# Patient Record
Sex: Male | Born: 1935 | Race: Black or African American | Hispanic: No | Smoking: Never smoker
Health system: Southern US, Community
[De-identification: ages and names within clinical notes are randomized; demographics above are authoritative.]

## PROBLEM LIST (undated history)

## (undated) DIAGNOSIS — F32A Depression, unspecified: Secondary | ICD-10-CM

## (undated) DIAGNOSIS — M1990 Unspecified osteoarthritis, unspecified site: Secondary | ICD-10-CM

## (undated) DIAGNOSIS — R0602 Shortness of breath: Secondary | ICD-10-CM

## (undated) DIAGNOSIS — G20 Parkinson's disease: Secondary | ICD-10-CM

## (undated) DIAGNOSIS — K219 Gastro-esophageal reflux disease without esophagitis: Secondary | ICD-10-CM

## (undated) DIAGNOSIS — F419 Anxiety disorder, unspecified: Secondary | ICD-10-CM

## (undated) DIAGNOSIS — F329 Major depressive disorder, single episode, unspecified: Secondary | ICD-10-CM

## (undated) DIAGNOSIS — G20A1 Parkinson's disease without dyskinesia, without mention of fluctuations: Secondary | ICD-10-CM

## (undated) HISTORY — PX: CERVICAL SPINE SURGERY: SHX589

---

## 2008-09-02 ENCOUNTER — Emergency Department (HOSPITAL_COMMUNITY): Admission: EM | Admit: 2008-09-02 | Discharge: 2008-09-02 | Payer: Self-pay | Admitting: Emergency Medicine

## 2008-10-08 ENCOUNTER — Inpatient Hospital Stay (HOSPITAL_COMMUNITY): Admission: RE | Admit: 2008-10-08 | Discharge: 2008-10-09 | Payer: Self-pay | Admitting: Neurosurgery

## 2010-05-22 LAB — BASIC METABOLIC PANEL
BUN: 5 mg/dL — ABNORMAL LOW (ref 6–23)
CO2: 25 mEq/L (ref 19–32)
Calcium: 9 mg/dL (ref 8.4–10.5)
Chloride: 109 mEq/L (ref 96–112)
Creatinine, Ser: 0.98 mg/dL (ref 0.4–1.5)
GFR calc Af Amer: >60 mL/min (ref >60)
GFR calc non Af Amer: >60 mL/min (ref >60)
Glucose, Bld: 100 mg/dL — ABNORMAL HIGH (ref 70–99)
Potassium: 4.1 mEq/L (ref 3.5–5.1)
Sodium: 140 mEq/L (ref 135–145)

## 2010-05-22 LAB — CBC
HCT: 41 % (ref 39.0–52.0)
Hemoglobin: 13.7 g/dL (ref 13.0–17.0)
MCHC: 33.5 g/dL (ref 30.0–36.0)
MCV: 91 fL (ref 78.0–100.0)
Platelets: 151 10*3/uL (ref 150–400)
RBC: 4.51 MIL/uL (ref 4.22–5.81)
RDW: 14.1 % (ref 11.5–15.5)
WBC: 3.2 10*3/uL — ABNORMAL LOW (ref 4.0–10.5)

## 2010-06-29 NOTE — Op Note (Signed)
NAMEKONGMENG, SANTORO NO.:  [id number]   MEDICAL RECORD NO.:  [id number]          PATIENT TYPE:  INP   LOCATION:  3528                         FACILITY:  [facility]   PHYSICIAN:  Hewitt Shorts, M.D.DATE OF BIRTH:  [birth year]   DATE OF PROCEDURE:  10/08/2008  DATE OF DISCHARGE:                               OPERATIVE REPORT   PREOPERATIVE DIAGNOSES:  1. C6-7 cervical disk herniation.  2. Cervical degenerative disk disease.  3. Cervical spondylosis.  4. Cervical radiculopathy.   POSTOPERATIVE DIAGNOSES:  1. C6-7 cervical disk herniation.  2. Cervical degenerative disk disease.  3. Cervical spondylosis.  4. Cervical radiculopathy.   PROCEDURE:  C6-7 anterior cervical decompression arthrodesis with  allograft and tether cervical plating.   SURGEON:  Hewitt Shorts, MD   ASSISTANT:  Nelia Shi. Webb Silversmith, RN and Trey Sailors, MD   ANESTHESIA:  General endotracheal.   INDICATIONS:  The patient is a 76 year old man who presented with a  right cervical radiculopathy with right triceps weakness.  He has  multilevel degenerative disk disease and spondylosis.  He was found to  have a significant disk herniation at the C6-7 level.  Decision was made  to proceed with elective single-level anterior cervical decompression  arthrodesis.   PROCEDURE:  The patient was brought to the operating room and placed  under general endotracheal anesthesia.  The patient was placed in 10  pounds of Holter traction.  The neck was prepped with Betadine soap and  solution and draped in a sterile fashion.  A horizontal incision was  made in the left side of the neck.  The line of the incision was  infiltrated with local anesthetic with epinephrine.  Dissection was  carried down to the subcutaneous tissue and platysma.  Bipolar  electrocautery was used to maintain hemostasis.  Dissection was then  carried out through an avascular plane to the sternocleidomastoid,  carotid artery, and  jugular vein laterally and trachea and esophagus  medially.  The ventral aspect of the vertebral column was identified and  localized and x-ray taken and the C6-7 disk space was identified.   Diskectomy was begun with incision of the annulus and continued with  microcurettes and pituitary rongeurs.  Anterior osteophytic overgrowth  was removed using osteophyte removal tool.  The operating microscope was  draped and brought into the field to provide additional navigation,  illumination, and visualization.  The remainder of the decompression  performed using microdissection and microsurgical technique.  The  diskectomy was continued posteriorly through the disk space, then the  cauterized endplates were removed using microcurettes along with the  Stryker high speed drill and then posterior osteophytic overgrowth was  removed using the Stryker high speed drill along with a 2-mm Kerrison  punch with a thin foot plates.  The posterior longitudinal ligament was  removed and we continued dissection laterally towards the neuroforamen  as we approached the right neuroforamen and continued the decompression.  We encountered large disk herniation with multiple fragments compressing  the exiting right C7 nerve root.  These were mobilized with a micro hook  and removed  and good decompression of the thecal sac and nerve root was  achieved.  In the end, all loose fragments and disk material to the  right side were removed.   We then decompressed the left C6-7 neuroforamen and again we found a  small fragment of disk herniation and this was removed as well.  In the  end, good decompression of the spinal canal and thecal sac as well as  the neuroforamen exiting nerve roots was achieved bilaterally.   Once the decompression was completed, hemostasis was established with  the use of Gelfoam soaked in thrombin, and the we measured the height of  intravertebral disk space and selected a 7-mm allograft  implant.  It was  hydrated in saline solutions, then positioned in the intervertebral disk  space and countersunk.  Cervical traction was discontinued.  Gelfoam was  placed laterally, good hemostasis was established, and then we selected  a 14-mm tethered cervical plate.  It was secured to each of the  vertebral with a pair of 4 x 14 mm variable angle screws.  Each screw  hole was started with a drill and then screws placed in alternating  fashion.  Once all 4 screws were in place, final tightening was  performed.  An x-ray was taken, which showed the graft, plate, and  screws in good position, the alignment was good.  The wound was  irrigated with Bacitracin solution.  Checked for hemostasis, which was  established and confirmed and then we proceeded with closure.  The  platysma was closed with interrupted inverted 2-0 undyed Vicryl sutures.  The subcutaneous and subcuticular were closed with interrupted inverted  3-0 undyed Vicryl sutures.  The skin was re-approximated with Dermabond.  The procedure was tolerated well.  The estimated blood loss was 50 mL.  Sponge and needle count were correct.  Following the surgery, the  patient was placed in a soft cervical collar, reversed from the  anesthetic, extubated, and transferred to the recovery room for further  care where she was noted to be moving all 4 extremities to command.      Hewitt Shorts, M.D.  Electronically Signed     RWN/MEDQ  D:  10/08/2008  T:  10/08/2008  Job:  [id number]

## 2011-01-18 ENCOUNTER — Encounter (HOSPITAL_COMMUNITY): Payer: Self-pay | Admitting: Pharmacy Technician

## 2011-01-19 ENCOUNTER — Other Ambulatory Visit: Payer: Self-pay | Admitting: Cardiology

## 2011-01-19 MED ORDER — SODIUM CHLORIDE 0.9 % IJ SOLN: 3.0000 mL | INTRAMUSCULAR | Status: DC | PRN

## 2011-01-19 MED ORDER — SODIUM CHLORIDE 0.9 % IJ SOLN: 3.0000 mL | Freq: Two times a day (BID) | INTRAMUSCULAR | Status: DC

## 2011-01-19 MED ORDER — SODIUM CHLORIDE 0.9 % IV SOLN: 250.0000 mL | INTRAVENOUS | Status: DC | PRN

## 2011-01-19 NOTE — H&P (Signed)
Patient: Louis Harris, Louis Harris Provider: Kanika Bungert, MD  DOB: 10/23/1934   Age: 76 Y   Sex: Male Date: 01/18/2011  Phone: [phone]  Address: [address], [place], [id number]  Pcp: RONALD POLITE    --------------------------------------------------------------------------------  Subjective:    CC:      1. MS/to discuss cath set up.      HPI:     General:           76-year-old with hyperlipidemia here for evaluation of dyspnea on exertion. Denies any palpitations/diaphoresis but has spells of dyspnea typically with stairs or while mowing the lawn. Chest pressure noted the other night after going up stairs. In 2009 he had a similar complaint an EKG was done at that time and was unremarkable. These episodes have been increasing in frequency. No orthopnea. No diabetes, no hypertension but he does have a family history of coronary artery disease in his father and also has a BMI of 30.          Right bundle branch block on EKG with no ST segment changes, chest x-ray showed no acute disease.          ECHO:          1. Moderate asymmetric septal hypertrophy.          2. Left ventricular ejection fraction estimated by 2D at 60-65 percent.          3. There were no regional wall motion abnormalities.          4. Mild left atrial enlargement.          5. Trivial tricuspid regurgitation.          6. Trace of pulmonic valve regurgitation.          7. Analysis of mitral valve inflow, pulmonary vein Doppler and tissue Doppler suggests grade I diastolic dysfunction without elevated left atrial pressure.          NUC STRESS: Low risk, no ischemia.                   Still having chest pain/dyspnea at rest and with exertion. had an episode while watching TV the other night. Has had previous episodes while doing lawn work.Very concerning to him. .      ROS:      The other elements of the review of systems are negative (12 total elements).     Medical History: BPH, High cholesterol, History of  migraines, Hemoorhoids, History of diarrhea resolved, egd 2006 questionable celiac disease, but poor reponse to gluten free diet, currently asymptomatic, Cataracts, cervical radiculopathy Neurosurgeon Dr. Neudleman, ortho Dr. Rendall, Opthomalogist Dr. Brewington, ENT Dr. Newman.      Family History:  Father: deceased CAD, hypertension Mother: deceased unknown Brother 1: deceased Sister 1: alive diabetes mellitus 1 brother(s) , 1 sister(s) .      Social History:      General:          History of smoking  cigarettes: Never smoked.           Smoking: no.          no Alcohol, no.          no Recreational drug use.          Occupation: retired, originally from [place].          Marital Status: Married.      Medications: Aspir-81 81 MG Tablet Delayed Release 1 tablet Once a day, Ibuprofen 400 MG Tablet   1 tablet as needed PRN, Cardura 4 MG Tablet 1 tablet Once a day, Proscar 5 MG Tablet 1 tablet Once a day, Nexium 40 MG Capsule Delayed Release 1 capsule Once a day, Mirapex 0.125 MG Tablet 2 tablets Once a day, Gabapentin 100 MG Capsule 3 caps qhs qhs, Fish Oil 1000 MG Capsule 1 capsule with a meal Once a day, Medication List reviewed and reconciled with the patient     Allergies: Prilosec: diarrhea, Darvon.      Objective:    Vitals: Wt 195, Wt change 1 lb, Ht 66.5, BMI 31.00, Pulse sitting 88, BP sitting 130/80.     Examination:     General Examination:         GENERAL APPEARANCE alert, oriented, NAD, pleasant.          SKIN: normal, no rash.          HEENT: normal.          HEAD: [place]/AT.          EYES: EOMI, Conjunctiva pale.          NECK: supple, FROM, without evidence of thyromegaly, adenopathy, or bruits, no jugular venous distention (JVD).          LUNGS: clear to auscultation bilaterally, no wheezes, rhonchi, rales, regular breathing rate and effort.          HEART: regular rate and rhythm, no S3, S4, murmur or rub, point of maximul impulse (PMI) normal.          ABDOMEN: soft,  non-tender/non-distended, bowel sounds present, no masses palpated, no bruit.          EXTREMITIES: no clubbing, no edema, pulses 2 plus bilaterally.          NEUROLOGIC EXAM: non-focal exam, alert and oriented x 3.          PERIPHERAL PULSES: normal (2+) bilaterally.          LYMPH NODES: no cervical adenopathy.          PSYCH affect normal.       Prior medical records reviewed, creatinine 1.06, LDL cholesterol 110, HDL 31, TSH 0.8. I do not see a recent CBC. Last one in 2009 showed a hemoglobin of 14.5. Complete exam from prior encounter. Note: excellent radial pulse.      Assessment:    Assessment:  1. Dyspnea - 786.05 (Primary)   2. Chest pain - 786.50   3. Hyperlipidemia - 272.4     Plan:    1. Dyspnea        LAB: BASIC METABOLIC     GLUCOSE 93 70-99 - MG/DL        BUN 9 6-26 - MG/DL        CREATININE 1.00 0.60-1.30 - MG/DL        eGFR (NON-AFRICAN AMERICAN) 73.04 >60.00 - ML/MIN        eGFR (AFRICAN AMERICAN) 88.38 >60.00 - ML/MIN        SODIUM 138 136-145 - MMOL/L        POTASSIUM 4.2 3.5-5.5 - MMOL/L        CHLORIDE 105 98-107 - MMOL/L        C02 29 22-32 - MMOL/L        ANION GAP 8.8 6.0-20.0 - MMOL/L        CALCIUM 9.6 8.6-10.3 - MG/DL         LAB: CBC WITH DIFF      WBC 3.3 4.0-11.0 - K/uL L         RBC 4.53 4.20-5.80 - M/uL        HGB 13.8 13.0-17.0 - g/dL        HCT 41.5 39.0-52.0 - %        MCV 91.7 80.0-94.0 - fL        MCHC 33.3 32.0-36.0 - g/dL        RDW 14.4 11.5-15.5 - %        PLT 169 150-400 - K/uL        NEUT % 62.6 43.3-71.9 - %        LYMPH% 26.2 16.8-43.5 - %        MONO % 9.1 4.6-12.4 - %        EOS % 1.80 0.00-7.80 - %        BASO % 0.3 0.0-1.0 - %        NEUT # 2.0 1.9-7.2 - K/uL         LYMPH# 0.90 1.10-2.73 - K/uL L       MONO # 0.3 0.3-0.8 - K/uL        EOS # 0.10 0.00-0.60 - K/uL        BASO # 0.00 0.00-0.10 - K/uL         LAB: PT (Prothrombin Time) ([id number]), LC  ok for cath     Prothrombin Time 11.2 9.1-12.0 - sec        INR 1.0  0.8-1.2 -               Smart,Jeremy 01/19/2011 09:14:41 AM > okay for cath   With ongoing symptoms concerning to him, possibly anginal symptoms, I will proceed with cardiac catheterization.  main lab, radial approach. Risks and benefits of cardiac catheterization have been reviewed including risk of stroke, heart attack, death, bleeding, renal impariment and arterial damage. There was ample oppurtuny to answer questions. Alternatives were discussed. Patient understands and wishes to proceed.          Immunizations:       Labs:      Procedure Codes: 80048 ECL BMP, 85025 ECL CBC PLATELET DIFF, 85610 PROTHROMBIN TIME, 36415 BLOOD COLLECTION ROUTINE VENIPUNCTURE     Preventive:           Follow Up: post cath        Provider: Maryjo Ragon, MD  Patient: Louis Harris, Louis Harris  DOB: 02/06/1935  Date: 01/18/2011   

## 2011-01-21 ENCOUNTER — Encounter (HOSPITAL_COMMUNITY): Payer: Self-pay | Admitting: Cardiology

## 2011-01-21 ENCOUNTER — Encounter (HOSPITAL_COMMUNITY)
Admission: RE | Disposition: A | Discharge status: Home / Self Care | Payer: Self-pay | Source: Ambulatory Visit | Attending: Cardiology

## 2011-01-21 ENCOUNTER — Other Ambulatory Visit: Payer: Self-pay

## 2011-01-21 ENCOUNTER — Ambulatory Visit (HOSPITAL_COMMUNITY)
Admission: RE | Admit: 2011-01-21 | Discharge: 2011-01-21 | Disposition: A | Discharge status: Home / Self Care | Payer: Medicare Other | Source: Ambulatory Visit | Attending: Cardiology | Admitting: Cardiology

## 2011-01-21 DIAGNOSIS — E7800 Pure hypercholesterolemia, unspecified: Secondary | ICD-10-CM | POA: Diagnosis present

## 2011-01-21 DIAGNOSIS — R0602 Shortness of breath: Secondary | ICD-10-CM | POA: Diagnosis present

## 2011-01-21 DIAGNOSIS — R0609 Other forms of dyspnea: Secondary | ICD-10-CM | POA: Insufficient documentation

## 2011-01-21 DIAGNOSIS — R0989 Other specified symptoms and signs involving the circulatory and respiratory systems: Secondary | ICD-10-CM | POA: Insufficient documentation

## 2011-01-21 HISTORY — PX: LEFT HEART CATHETERIZATION WITH CORONARY ANGIOGRAM: SHX5451

## 2011-01-21 SURGERY — LEFT HEART CATHETERIZATION WITH CORONARY ANGIOGRAM
Anesthesia: LOCAL

## 2011-01-21 MED ORDER — HEPARIN (PORCINE) IN NACL 2-0.9 UNIT/ML-% IJ SOLN
INTRAMUSCULAR | Status: AC
  Filled 2011-01-21: qty 2000

## 2011-01-21 MED ORDER — LIDOCAINE HCL (PF) 1 % IJ SOLN
INTRAMUSCULAR | Status: AC
  Filled 2011-01-21: qty 30

## 2011-01-21 MED ORDER — DIAZEPAM 5 MG PO TABS
5.0000 mg | ORAL_TABLET | ORAL | Status: AC
  Administered 2011-01-21: 5 mg via ORAL
  Filled 2011-01-21: qty 1

## 2011-01-21 MED ORDER — SODIUM CHLORIDE 0.9 % IV SOLN
INTRAVENOUS | Status: DC
  Administered 2011-01-21: 1000 mL via INTRAVENOUS

## 2011-01-21 MED ORDER — ASPIRIN 81 MG PO CHEW
324.0000 mg | CHEWABLE_TABLET | ORAL | Status: AC
  Administered 2011-01-21: 324 mg via ORAL
  Filled 2011-01-21: qty 4

## 2011-01-21 MED ORDER — MIDAZOLAM HCL 2 MG/2ML IJ SOLN
INTRAMUSCULAR | Status: AC
  Filled 2011-01-21: qty 2

## 2011-01-21 MED ORDER — VERAPAMIL HCL 2.5 MG/ML IV SOLN
INTRAVENOUS | Status: AC
  Filled 2011-01-21: qty 2

## 2011-01-21 MED ORDER — NITROGLYCERIN 0.2 MG/ML ON CALL CATH LAB
INTRAVENOUS | Status: AC
  Filled 2011-01-21: qty 1

## 2011-01-21 MED ORDER — HEPARIN SODIUM (PORCINE) 1000 UNIT/ML IJ SOLN
INTRAMUSCULAR | Status: AC
  Filled 2011-01-21: qty 1

## 2011-01-21 MED ORDER — SODIUM CHLORIDE 0.9 % IV SOLN: 1.0000 mL/kg/h | INTRAVENOUS | Status: DC

## 2011-01-21 MED ORDER — FENTANYL CITRATE 0.05 MG/ML IJ SOLN
INTRAMUSCULAR | Status: AC
  Filled 2011-01-21: qty 2

## 2011-01-21 NOTE — Op Note (Signed)
PROCEDURE:  Left heart catheterization with selective coronary angiography, left ventriculogram via the radial artery approach.  INDICATIONS:  76 year old male with ongoing dyspnea quite severe worrisome to him with recent nuclear stress test showing no perfusion abnormalities and normal EF. Concern is therefore possible triple-vessel disease missed by perfusion imaging. With ongoing symptoms.  The risks, benefits, and details of the procedure were explained to the patient, including possibilities of stroke, heart attack, death, renal impairment, arterial damage, bleeding.  The patient verbalized understanding and wanted to proceed.  Informed written consent was obtained.  PROCEDURE TECHNIQUE:  Allen's test was performed pre-and post procedure and was normal. The right radial artery site was prepped and draped in a sterile fashion. One percent lidocaine was used for local anesthesia. Using the modified Seldinger technique a 5 French hydrophilic sheath was inserted into the radial artery without difficulty. 3 mg of verapamil was administered via the sheath. A Judkins right #4 catheter with the guidance of a Versicore wire was placed in the right coronary cusp and selectively cannulated the right coronary artery. After traversing the aortic arch, 4000 units of heparin IV was administered. A easy radial left heart catheter was used to selectively cannulate the left main artery. Multiple views with hand injection of Omnipaque were obtained. Catheter a pigtail catheter was used to cross into the left ventricle, hemodynamics were obtained, and a left ventriculogram was performed in the RAO position with power injection. Following the procedure, sheath was removed, patient was hemodynamically stable, hemostasis was maintained with a Terumo T band.   CONTRAST:  Total of 130 ml.  COMPLICATIONS:  None.    HEMODYNAMICS:  Aortic pressure was 125/73 with a mean of 97; LV pressure was 123 systolic; LVEDP 19.  There  was no gradient between the left ventricle and aorta.    ANGIOGRAPHIC DATA:    Left main: No angiographically significant coronary artery disease. Long left main. Minor, 10% mid segment stenosis.  Left anterior descending (LAD): In the mid vessel, the vessel tapers to become smaller in caliber however there is no significant stenosis present. 3 small diagonal branches noted..  Circumflex artery (CIRC): Small caliber circumflex artery with 2 small obtuse marginal branches no angiographic significant disease present..  Right coronary artery (RCA): The dominant vessel giving rise to the PDA, no angiographically significant disease.  LEFT VENTRICULOGRAM:  Left ventricular angiogram was done in the 30 RAO projection and revealed normal left ventricular wall motion and systolic function with an estimated ejection fraction of 55 %.  LVEDP was 19 mmHg.  IMPRESSIONS:  No angiographically significant CAD Normal left ventricular systolic function.  LVEDP 19 mmHg.  Ejection fraction 55 %.  RECOMMENDATION:  Reassuring heart catheterization. Noncardiac cause for dyspnea that seems to be occurring at times at rest. [facility] if anxiety is playing a role. Possible bronchospasm. Reassurance has been given to him.

## 2011-01-21 NOTE — Interval H&P Note (Signed)
History and Physical Interval Note:  01/21/2011 6:29 AM  Louis Harris  has presented today for surgery, with the diagnosis of abnormal stress/chest pain  The various methods of treatment have been discussed with the patient and family. After consideration of risks, benefits and other options for treatment, the patient has consented to  Procedure(s): LEFT HEART CATHETERIZATION WITH CORONARY ANGIOGRAM as a surgical intervention .  The patients' history has been reviewed, patient examined, no change in status, stable for surgery.  I have reviewed the patients' chart and labs.  Questions were answered to the patient's satisfaction.     SKAINS, MARK

## 2011-01-21 NOTE — H&P (View-Only) (Signed)
Patient: Louis Harris Provider: Donato Schultz, MD  DOB: [birth year]   Age: 76 Y   Sex: Male Date: 01/18/2011  Phone: [phone]  Address: [address], [place], [place]  Pcp: RONALD POLITE    --------------------------------------------------------------------------------  Subjective:    CC:      1. MS/to discuss cath set up.      HPI:     General:           76 year old with hyperlipidemia here for evaluation of dyspnea on exertion. Denies any palpitations/diaphoresis but has spells of dyspnea typically with stairs or while mowing the lawn. Chest pressure noted the other night after going up stairs. In 2009 he had a similar complaint an EKG was done at that time and was unremarkable. These episodes have been increasing in frequency. No orthopnea. No diabetes, no hypertension but he does have a family history of coronary artery disease in his father and also has a BMI of 30.          Right bundle branch block on EKG with no ST segment changes, chest x-ray showed no acute disease.          ECHO:          1. Moderate asymmetric septal hypertrophy.          2. Left ventricular ejection fraction estimated by 2D at 60-65 percent.          3. There were no regional wall motion abnormalities.          4. Mild left atrial enlargement.          5. Trivial tricuspid regurgitation.          6. Trace of pulmonic valve regurgitation.          7. Analysis of mitral valve inflow, pulmonary vein Doppler and tissue Doppler suggests grade I diastolic dysfunction without elevated left atrial pressure.          NUC STRESS: Low risk, no ischemia.                   Still having chest pain/dyspnea at rest and with exertion. had an episode while watching TV the other night. Has had previous episodes while doing lawn work.Very concerning to him. .      ROS:      The other elements of the review of systems are negative (12 total elements).     Medical History: BPH, High cholesterol, History of  migraines, Hemoorhoids, History of diarrhea resolved, egd 2006 questionable celiac disease, but poor reponse to gluten free diet, currently asymptomatic, Cataracts, cervical radiculopathy Neurosurgeon Dr. Mauri Pole, ortho Dr. Priscille Kluver, Opthomalogist Dr. Mitzi Davenport, ENT Dr. Ezzard Standing.      Family History:  Father: deceased CAD, hypertension Mother: deceased unknown Brother 1: deceased Sister 1: alive diabetes mellitus 1 brother(s) , 1 sister(s) .      Social History:      General:          History of smoking  cigarettes: Never smoked.           Smoking: no.          no Alcohol, no.          no Recreational drug use.          Occupation: retired, originally from [place].          Marital Status: Married.      Medications: Aspir-81 81 MG Tablet Delayed Release 1 tablet Once a day, Ibuprofen 400 MG Tablet  1 tablet as needed PRN, Cardura 4 MG Tablet 1 tablet Once a day, Proscar 5 MG Tablet 1 tablet Once a day, Nexium 40 MG Capsule Delayed Release 1 capsule Once a day, Mirapex 0.125 MG Tablet 2 tablets Once a day, Gabapentin 100 MG Capsule 3 caps qhs qhs, Fish Oil 1000 MG Capsule 1 capsule with a meal Once a day, Medication List reviewed and reconciled with the patient     Allergies: Prilosec: diarrhea, Darvon.      Objective:    Vitals: Wt 195, Wt change 1 lb, Ht 66.5, BMI 31.00, Pulse sitting 88, BP sitting 130/80.     Examination:     General Examination:         GENERAL APPEARANCE alert, oriented, NAD, pleasant.          SKIN: normal, no rash.          HEENT: normal.          HEAD: [place]/AT.          EYES: EOMI, Conjunctiva pale.          NECK: supple, FROM, without evidence of thyromegaly, adenopathy, or bruits, no jugular venous distention (JVD).          LUNGS: clear to auscultation bilaterally, no wheezes, rhonchi, rales, regular breathing rate and effort.          HEART: regular rate and rhythm, no S3, S4, murmur or rub, point of maximul impulse (PMI) normal.          ABDOMEN: soft,  non-tender/non-distended, bowel sounds present, no masses palpated, no bruit.          EXTREMITIES: no clubbing, no edema, pulses 2 plus bilaterally.          NEUROLOGIC EXAM: non-focal exam, alert and oriented x 3.          PERIPHERAL PULSES: normal (2+) bilaterally.          LYMPH NODES: no cervical adenopathy.          PSYCH affect normal.       Prior medical records reviewed, creatinine 1.06, LDL cholesterol 110, HDL 31, TSH 0.8. I do not see a recent CBC. Last one in 2009 showed a hemoglobin of 14.5. Complete exam from prior encounter. Note: excellent radial pulse.      Assessment:    Assessment:  1. Dyspnea - 786.05 (Primary)   2. Chest pain - 786.50   3. Hyperlipidemia - 272.4     Plan:    1. Dyspnea        LAB: BASIC METABOLIC     GLUCOSE 93 70-99 - MG/DL        BUN 9 4-09 - MG/DL        CREATININE 8.11 0.60-1.30 - MG/DL        eGFR (NON-AFRICAN AMERICAN) 73.04 >60.00 - ML/MIN        eGFR (AFRICAN AMERICAN) 88.38 >60.00 - ML/MIN        SODIUM 138 136-145 - MMOL/L        POTASSIUM 4.2 3.5-5.5 - MMOL/L        CHLORIDE 105 98-107 - MMOL/L        C02 29 22-32 - MMOL/L        ANION GAP 8.8 6.0-20.0 - MMOL/L        CALCIUM 9.6 8.6-10.3 - MG/DL         LAB: CBC WITH DIFF      WBC 3.3 4.0-11.0 - K/uL L  RBC 4.53 4.20-5.80 - M/uL        HGB 13.8 13.0-17.0 - g/dL        HCT 16.1 09.6-04.5 - %        MCV 91.7 80.0-94.0 - fL        MCHC 33.3 32.0-36.0 - g/dL        RDW 40.9 81.1-91.4 - %        PLT 169 150-400 - K/uL        NEUT % 62.6 43.3-71.9 - %        LYMPH% 26.2 16.8-43.5 - %        MONO % 9.1 4.6-12.4 - %        EOS % 1.80 0.00-7.80 - %        BASO % 0.3 0.0-1.0 - %        NEUT # 2.0 1.9-7.2 - K/uL         LYMPH# 0.90 1.10-2.73 - K/uL L       MONO # 0.3 0.3-0.8 - K/uL        EOS # 0.10 0.00-0.60 - K/uL        BASO # 0.00 0.00-0.10 - K/uL         LAB: PT (Prothrombin Time) ([id number]), LC  ok for cath     Prothrombin Time 11.2 9.1-12.0 - sec        INR 1.0  0.8-1.2 -               Smart,Jeremy 01/19/2011 09:14:41 AM > okay for cath   With ongoing symptoms concerning to him, possibly anginal symptoms, I will proceed with cardiac catheterization. [place] main lab, radial approach. Risks and benefits of cardiac catheterization have been reviewed including risk of stroke, heart attack, death, bleeding, renal impariment and arterial damage. There was ample oppurtuny to answer questions. Alternatives were discussed. Patient understands and wishes to proceed.          Immunizations:       Labs:      Procedure Codes: 08657 ECL BMP, 85025 ECL CBC PLATELET DIFF, 85610 PROTHROMBIN TIME, 36415 BLOOD COLLECTION ROUTINE VENIPUNCTURE     Preventive:           Follow Up: post cath        Provider: Donato Schultz, MD  Patient: Sheamus, Hasting  DOB: 13-Oct-1934  Date: 01/18/2011

## 2012-11-16 ENCOUNTER — Other Ambulatory Visit: Payer: Self-pay | Admitting: Ophthalmology

## 2012-11-16 NOTE — H&P (Signed)
Patient Record  Louis Harris, Louis Harris  Patient Number:  [id number] Date of Birth:  05-25-1934 Age:  78 years old    Gender:  Male Date of Evaluation:  November 16, 2012  Chief Complaint:   78 year old male complaining of decreased vision. He is referred for Cataract evaluation. He also complains of a left lateral canthal cyst which is causing persistent irrtitation OS. History of Present Illness:   Follow up dry eye syndrome.  Has cataracts.  Does not see as clearly as h e  has been seeing.  C/O bump lateral canthus os.  Cyst painful at times.  Has stinging and itching.  On systane.  Has used systane.  Uses them about two-three times a day.  No pain or discomfort.  Has occaisional pain os.  Pain usually relieved by hot towels.  On I Caps for macular Degeneration.  Presents for evaluation. Past History:  Allergies:  Darvon (propoxyphene) (unspecified), Active Medications:   Ocular Medications:  Systane (peg 400-propylene glycol) Drops 0.4-0.3% Other Medications:  gabapentin (gabapentin) by Nadyne Coombes, capsule 100 mg, Mirapex (pramipexole) by Nadyne Coombes, tablet 0.125 mg, pantoprazole (pantoprazole) by Nadyne Coombes, tablet,delayed release (DR/EC) 40 mg, aspirin (aspirin) tablet,delayed release (DR/EC) 81 mg, Cardura (doxazosin) tablet 4 mg 1 tablet by mouth every night tablet, Proscar (finasteride) tablet 5 mg 1 tablet by mouth every morning tablet, Systane (peg 400-propylene glycol) drops 0.4-0.3% Birth History:  none Past Ocular History:   cataract   dry eye syndrome  macular degeneration Past Medical History:  prostate cancer  Past Surgical History:  replace disk  hernia repair  Family History:  no amblyopia, no blindness, + cataracts (grandmother), no crossed eyes, no diabetic retinopathy, no glaucoma, no macular degeneration, no retinal detachment, + cancer (uncle), no diabetes, + heart disease (aunt), no high blood pressure, no stroke Social History:   Smoking Status: never  smoker  Alcohol:  none  Driving status:  driving  Marital status:  married Review of Systems:   Eyes: + dryness  All other systems are negative.  Examination:  Visual Acuity:   Distance VA cc:  OD: 20/40-1    OS: 20/40  Distance VA [place]:  OD: 20/60    OS: 20/60 IOP:  OD:  17     OS:  19    @ 01:19PM (Goldmann applanation) Manifest Refraction:    Sphere    Cyl Axis       VA         Add       VA Prism Base R:  +1.50  -1.00   95    20/40       +2.75                      L:  +1.75  -1.00   85    20/50       +2.75                        Confrontation visual field:  OU:  Normal  Motility:  OU:  Normal  Pupils:  OU:  Shape, size, direct and consensual reaction normal  Adnexa:  L:  tenderness in upper medial orbit to touch with cotton tipped applicator. Preauricular LN, lacrimal drainage, lacrimal glands, orbit normal  Eyelids:  R:  normal   L:  large cyst lateral canthus  Conjunctiva: OU:  bulbar, palpebral normal  Cornea: OU:  epithelium, stroma, endothelium,  tear film normal  Anterior Chamber: OU:  depth normal, no cell, no flare  Iris: OU:  normal Dilation:  OU: AK-Dilate, Tropicacyl @ 03:21PM  Lens:  OD:  2+ nuclear sclerotic cataract OS:  3+ centrally dense  nuclear sclerotic cataract  Vitreous:  OD:  normal OS:  PVD with operculum OS  Optic Disc: OD:  cupping: 0.6 x 0.55  OS:  cupping: 0.7 x 0.65  Macula:  OU:  questionable RPE defect ou; refractile floater over macula os  Vessels: OU:  normal  Periphery: OU:  normal  Orientation to person, place and time:  Normal  Mood and affect:  Normal  Impression:  366.16  Nuclear Sclerotic Cataract OS>OD 362.51  Macular Degeneration, dry OU: stable 370.33  Dry eye, keratoconjunctivitis sicca OU -  -recommended art tears at least QID, prn 374.84  Cyst of left eyelid margin  Plan/Treatment:  Cataract: We discussed the natural history of Cataracts with illustrations. We discussed the related symptoms , visual significance and  when we intervene with surgery. We discussed the surgical techniques used, risks and benefits of surgery.  I have recommended proceeding with Phaco IOL in the left eye. He has a fluid filled large lateral canthal cyst OS. We will remove it at the same time as it is causing irritation.  He indicated understanding our discussion and felt that his questions had been answered to his satisfaction.  He indicates that he desires to proceed with the recommended treatment/care plan.   Medications:   Continue Systane gtts OS Q4-6 hrs WA Patient Instructions: Please do not eat anything after mignight the day before surgery. Return to clinic:  RV October 30th for post-operative follow-up   (electronically signed) Shade Flood, MD

## 2012-12-03 ENCOUNTER — Encounter (HOSPITAL_COMMUNITY): Payer: Self-pay | Admitting: Pharmacy Technician

## 2012-12-05 ENCOUNTER — Encounter (HOSPITAL_COMMUNITY): Payer: Self-pay

## 2012-12-05 ENCOUNTER — Encounter (HOSPITAL_COMMUNITY)
Admission: RE | Admit: 2012-12-05 | Discharge: 2012-12-05 | Disposition: A | Discharge status: Home / Self Care | Payer: Medicare Other | Source: Ambulatory Visit | Attending: Ophthalmology | Admitting: Ophthalmology

## 2012-12-05 ENCOUNTER — Ambulatory Visit (HOSPITAL_COMMUNITY)
Admission: RE | Admit: 2012-12-05 | Discharge: 2012-12-05 | Disposition: A | Discharge status: Home / Self Care | Payer: Medicare Other | Source: Ambulatory Visit | Attending: Anesthesiology | Admitting: Anesthesiology

## 2012-12-05 DIAGNOSIS — R9431 Abnormal electrocardiogram [ECG] [EKG]: Secondary | ICD-10-CM | POA: Insufficient documentation

## 2012-12-05 DIAGNOSIS — I498 Other specified cardiac arrhythmias: Secondary | ICD-10-CM | POA: Insufficient documentation

## 2012-12-05 DIAGNOSIS — Z01812 Encounter for preprocedural laboratory examination: Secondary | ICD-10-CM | POA: Insufficient documentation

## 2012-12-05 DIAGNOSIS — I4510 Unspecified right bundle-branch block: Secondary | ICD-10-CM | POA: Insufficient documentation

## 2012-12-05 DIAGNOSIS — Z01818 Encounter for other preprocedural examination: Secondary | ICD-10-CM | POA: Insufficient documentation

## 2012-12-05 DIAGNOSIS — J984 Other disorders of lung: Secondary | ICD-10-CM | POA: Insufficient documentation

## 2012-12-05 HISTORY — DX: Depression, unspecified: F32.A

## 2012-12-05 HISTORY — DX: Major depressive disorder, single episode, unspecified: F32.9

## 2012-12-05 HISTORY — DX: Anxiety disorder, unspecified: F41.9

## 2012-12-05 HISTORY — DX: Shortness of breath: R06.02

## 2012-12-05 HISTORY — DX: Unspecified osteoarthritis, unspecified site: M19.90

## 2012-12-05 HISTORY — DX: Gastro-esophageal reflux disease without esophagitis: K21.9

## 2012-12-05 LAB — BASIC METABOLIC PANEL
BUN: 8 mg/dL (ref 6–23)
Chloride: 103 mEq/L (ref 96–112)
GFR calc Af Amer: 80 mL/min — ABNORMAL LOW (ref >90)
GFR calc non Af Amer: 69 mL/min — ABNORMAL LOW (ref >90)
Potassium: 4.3 mEq/L (ref 3.5–5.1)
Sodium: 140 mEq/L (ref 135–145)

## 2012-12-05 LAB — CBC
HCT: 41.8 % (ref 39.0–52.0)
Hemoglobin: 14 g/dL (ref 13.0–17.0)
MCHC: 33.5 g/dL (ref 30.0–36.0)
RBC: 4.67 MIL/uL (ref 4.22–5.81)
WBC: 3.3 10*3/uL — ABNORMAL LOW (ref 4.0–10.5)

## 2012-12-05 NOTE — Pre-Procedure Instructions (Signed)
Louis Harris  12/05/2012   Your procedure is scheduled on:  02-11-2013  Wednesday   Report to [facility] Short Stay [facility]  2 * 3 at 10:30 AM.   Call this number if you have problems the morning of surgery: [phone]   Remember:   Do not eat food or drink liquids after midnight.    Take these medicines the morning of surgery with A SIP OF WATER: inhaler if needed,proscar(Finasteride),protonix   Do not wear jewelry  Do not wear lotions, powders, or perfumes.  Do not shave 48 hours prior to surgery. Men may shave face and neck.  Do not bring valuables to the hospital.  [facility] is not responsible for any belongings or valuables.               Contacts, dentures or bridgework may not be worn into surgery.                   Patients discharged the day of surgery will not be allowed to drive home.   Name and phone number of your driver:    Special Instructions: Shower using CHG 2 nights before surgery and the night before surgery.  If you shower the day of surgery use CHG.  Use special wash - you have one bottle of CHG for all showers.  You should use approximately 1/3 of the bottle for each shower.   Please read over the following fact sheets that you were given: Pain Booklet and Surgical Site Infection Prevention

## 2012-12-11 MED ORDER — PREDNISOLONE ACETATE 1 % OP SUSP
1.0000 [drp] | OPHTHALMIC | Status: AC
  Administered 2012-12-12: 1 [drp] via OPHTHALMIC
  Filled 2012-12-11: qty 5

## 2012-12-11 MED ORDER — GATIFLOXACIN 0.5 % OP SOLN
1.0000 [drp] | OPHTHALMIC | Status: AC | PRN
  Administered 2012-12-12 (×3): 1 [drp] via OPHTHALMIC
  Filled 2012-12-11: qty 2.5

## 2012-12-11 MED ORDER — PHENYLEPHRINE HCL 2.5 % OP SOLN
1.0000 [drp] | OPHTHALMIC | Status: AC | PRN
  Administered 2012-12-12 (×3): 1 [drp] via OPHTHALMIC
  Filled 2012-12-11: qty 2

## 2012-12-11 MED ORDER — TETRACAINE HCL 0.5 % OP SOLN
2.0000 [drp] | OPHTHALMIC | Status: AC
  Administered 2012-12-12: 2 [drp] via OPHTHALMIC
  Filled 2012-12-11: qty 2

## 2012-12-12 ENCOUNTER — Ambulatory Visit (HOSPITAL_COMMUNITY): Payer: Medicare Other | Admitting: Anesthesiology

## 2012-12-12 ENCOUNTER — Encounter (HOSPITAL_COMMUNITY)
Admission: RE | Disposition: A | Discharge status: Home / Self Care | Payer: Self-pay | Source: Ambulatory Visit | Attending: Ophthalmology

## 2012-12-12 ENCOUNTER — Ambulatory Visit (HOSPITAL_COMMUNITY)
Admission: RE | Admit: 2012-12-12 | Discharge: 2012-12-12 | Disposition: A | Discharge status: Home / Self Care | Payer: Medicare Other | Source: Ambulatory Visit | Attending: Ophthalmology | Admitting: Ophthalmology

## 2012-12-12 ENCOUNTER — Encounter (HOSPITAL_COMMUNITY): Payer: Medicare Other | Admitting: Anesthesiology

## 2012-12-12 DIAGNOSIS — H16209 Unspecified keratoconjunctivitis, unspecified eye: Secondary | ICD-10-CM | POA: Insufficient documentation

## 2012-12-12 DIAGNOSIS — Z8546 Personal history of malignant neoplasm of prostate: Secondary | ICD-10-CM | POA: Insufficient documentation

## 2012-12-12 DIAGNOSIS — H3530 Unspecified macular degeneration: Secondary | ICD-10-CM | POA: Insufficient documentation

## 2012-12-12 DIAGNOSIS — H04129 Dry eye syndrome of unspecified lacrimal gland: Secondary | ICD-10-CM | POA: Insufficient documentation

## 2012-12-12 DIAGNOSIS — H02829 Cysts of unspecified eye, unspecified eyelid: Secondary | ICD-10-CM | POA: Insufficient documentation

## 2012-12-12 DIAGNOSIS — Z79899 Other long term (current) drug therapy: Secondary | ICD-10-CM | POA: Insufficient documentation

## 2012-12-12 DIAGNOSIS — Z7982 Long term (current) use of aspirin: Secondary | ICD-10-CM | POA: Insufficient documentation

## 2012-12-12 DIAGNOSIS — H2589 Other age-related cataract: Secondary | ICD-10-CM | POA: Insufficient documentation

## 2012-12-12 HISTORY — PX: CATARACT EXTRACTION W/PHACO: SHX586

## 2012-12-12 SURGERY — PHACOEMULSIFICATION, CATARACT, WITH IOL INSERTION
Anesthesia: Monitor Anesthesia Care | Site: Eye | Laterality: Left | Wound class: Clean

## 2012-12-12 MED ORDER — HYPROMELLOSE (GONIOSCOPIC) 2.5 % OP SOLN
OPHTHALMIC | Status: DC | PRN
  Administered 2012-12-12: 2 [drp] via OPHTHALMIC

## 2012-12-12 MED ORDER — NA CHONDROIT SULF-NA HYALURON 40-30 MG/ML IO SOLN
INTRAOCULAR | Status: DC | PRN
  Administered 2012-12-12 (×2): 0.5 mL via INTRAOCULAR

## 2012-12-12 MED ORDER — HYPROMELLOSE (GONIOSCOPIC) 2.5 % OP SOLN
OPHTHALMIC | Status: AC
  Filled 2012-12-12: qty 15

## 2012-12-12 MED ORDER — LIDOCAINE HCL (CARDIAC) 20 MG/ML IV SOLN
INTRAVENOUS | Status: DC | PRN
  Administered 2012-12-12: 40 mg via INTRAVENOUS

## 2012-12-12 MED ORDER — TRIAMCINOLONE ACETONIDE 40 MG/ML IJ SUSP
INTRAMUSCULAR | Status: AC
  Filled 2012-12-12: qty 5

## 2012-12-12 MED ORDER — CEFAZOLIN SUBCONJUNCTIVAL INJECTION 100 MG/0.5 ML
INJECTION | SUBCONJUNCTIVAL | Status: DC | PRN
  Administered 2012-12-12: 100 mg via SUBCONJUNCTIVAL

## 2012-12-12 MED ORDER — DEXAMETHASONE SODIUM PHOSPHATE 10 MG/ML IJ SOLN
INTRAMUSCULAR | Status: AC
  Filled 2012-12-12: qty 1

## 2012-12-12 MED ORDER — TETRACAINE HCL 0.5 % OP SOLN
OPHTHALMIC | Status: AC
  Filled 2012-12-12: qty 2

## 2012-12-12 MED ORDER — PROPOFOL 10 MG/ML IV BOLUS
INTRAVENOUS | Status: DC | PRN
  Administered 2012-12-12: 60 mg via INTRAVENOUS

## 2012-12-12 MED ORDER — NA CHONDROIT SULF-NA HYALURON 40-30 MG/ML IO SOLN
INTRAOCULAR | Status: AC
  Filled 2012-12-12: qty 0.5

## 2012-12-12 MED ORDER — BACITRACIN-POLYMYXIN B 500-10000 UNIT/GM OP OINT
TOPICAL_OINTMENT | OPHTHALMIC | Status: AC
  Filled 2012-12-12: qty 3.5

## 2012-12-12 MED ORDER — BUPIVACAINE HCL (PF) 0.75 % IJ SOLN
INTRAMUSCULAR | Status: AC
  Filled 2012-12-12: qty 10

## 2012-12-12 MED ORDER — PROVISC 10 MG/ML IO SOLN
INTRAOCULAR | Status: DC | PRN
  Administered 2012-12-12: .85 mL via INTRAOCULAR

## 2012-12-12 MED ORDER — LIDOCAINE HCL 2 % IJ SOLN
INTRAMUSCULAR | Status: AC
  Filled 2012-12-12: qty 20

## 2012-12-12 MED ORDER — CEFAZOLIN SUBCONJUNCTIVAL INJECTION 100 MG/0.5 ML
200.0000 mg | INJECTION | SUBCONJUNCTIVAL | Status: DC
  Filled 2012-12-12: qty 1

## 2012-12-12 MED ORDER — EPINEPHRINE HCL 1 MG/ML IJ SOLN
INTRAMUSCULAR | Status: AC
  Filled 2012-12-12: qty 1

## 2012-12-12 MED ORDER — DEXAMETHASONE SODIUM PHOSPHATE 10 MG/ML IJ SOLN
INTRAMUSCULAR | Status: DC | PRN
  Administered 2012-12-12: 20 mg via INTRAVENOUS

## 2012-12-12 MED ORDER — LIDOCAINE HCL 2 % IJ SOLN
INTRAMUSCULAR | Status: DC | PRN
  Administered 2012-12-12: 14:00:00 via RETROBULBAR

## 2012-12-12 MED ORDER — SODIUM HYALURONATE 10 MG/ML IO SOLN
INTRAOCULAR | Status: AC
  Filled 2012-12-12: qty 0.85

## 2012-12-12 MED ORDER — ACETAZOLAMIDE SODIUM 500 MG IJ SOLR
INTRAMUSCULAR | Status: AC
  Filled 2012-12-12: qty 500

## 2012-12-12 MED ORDER — BACITRACIN-POLYMYXIN B 500-10000 UNIT/GM OP OINT
TOPICAL_OINTMENT | OPHTHALMIC | Status: DC | PRN
  Administered 2012-12-12: 1 via OPHTHALMIC

## 2012-12-12 MED ORDER — EPINEPHRINE HCL 1 MG/ML IJ SOLN
INTRAOCULAR | Status: DC | PRN
  Administered 2012-12-12: 14:00:00

## 2012-12-12 MED ORDER — SODIUM CHLORIDE 0.9 % IV SOLN
INTRAVENOUS | Status: DC
  Administered 2012-12-12: 11:00:00 via INTRAVENOUS

## 2012-12-12 MED ORDER — BSS IO SOLN
INTRAOCULAR | Status: AC
  Filled 2012-12-12: qty 500

## 2012-12-12 SURGICAL SUPPLY — 51 items
APPLICATOR COTTON TIP 6IN STRL (MISCELLANEOUS) ×2 IMPLANT
APPLICATOR DR MATTHEWS STRL (MISCELLANEOUS) ×2 IMPLANT
BLADE KERATOME 2.75 (BLADE) ×2 IMPLANT
BLADE STAB KNIFE 15DEG (BLADE) IMPLANT
COVER MAYO STAND STRL (DRAPES) ×2 IMPLANT
DRAPE OPHTHALMIC 77X100 STRL (CUSTOM PROCEDURE TRAY) ×2 IMPLANT
DRAPE POUCH INSTRU U-SHP 10X18 (DRAPES) ×2 IMPLANT
DRSG TEGADERM 4X4.75 (GAUZE/BANDAGES/DRESSINGS) ×2 IMPLANT
FILTER BLUE MILLIPORE (MISCELLANEOUS) IMPLANT
GLOVE SS BIOGEL STRL SZ 6.5 (GLOVE) ×1 IMPLANT
GLOVE SUPERSENSE BIOGEL SZ 6.5 (GLOVE) ×1
GLOVE SURG SS PI 6.5 STRL IVOR (GLOVE) ×2 IMPLANT
GOWN SRG XL XLNG 56XLVL 4 (GOWN DISPOSABLE) ×1 IMPLANT
GOWN STRL NON-REIN LRG LVL3 (GOWN DISPOSABLE) ×4 IMPLANT
GOWN STRL NON-REIN XL XLG LVL4 (GOWN DISPOSABLE) ×1
KIT BASIN OR (CUSTOM PROCEDURE TRAY) ×2 IMPLANT
KIT ROOM TURNOVER OR (KITS) ×2 IMPLANT
KNIFE GRIESHABER SHARP 2.5MM (MISCELLANEOUS) ×2 IMPLANT
LENS IOL ACRYSOF MP POST 21.0 (Intraocular Lens) ×2 IMPLANT
MASK EYE SHIELD (GAUZE/BANDAGES/DRESSINGS) ×2 IMPLANT
NEEDLE 22X1 1/2 (OR ONLY) (NEEDLE) ×2 IMPLANT
NEEDLE 25GX 5/8IN NON SAFETY (NEEDLE) ×6 IMPLANT
NEEDLE FILTER BLUNT 18X 1/2SAF (NEEDLE) ×1
NEEDLE FILTER BLUNT 18X1 1/2 (NEEDLE) ×1 IMPLANT
NEEDLE HYPO 30X.5 LL (NEEDLE) ×4 IMPLANT
NS IRRIG 1000ML POUR BTL (IV SOLUTION) ×2 IMPLANT
PACK CATARACT CUSTOM (CUSTOM PROCEDURE TRAY) ×2 IMPLANT
PAD ARMBOARD 7.5X6 YLW CONV (MISCELLANEOUS) ×2 IMPLANT
PAD EYE OVAL STERILE LF (GAUZE/BANDAGES/DRESSINGS) ×2 IMPLANT
PAK PIK CVS CATARACT (OPHTHALMIC) ×2 IMPLANT
PROBE ANTERIOR 20G W/INFUS NDL (MISCELLANEOUS) IMPLANT
SHUTTLE MONARCH TYPE A (NEEDLE) ×2 IMPLANT
SPEAR EYE SURG WECK-CEL (MISCELLANEOUS) IMPLANT
SUT ETHILON 10-0 CS-B-6CS-B-6 (SUTURE)
SUT ETHILON 5 0 P 3 18 (SUTURE)
SUT ETHILON 9 0 TG140 8 (SUTURE) IMPLANT
SUT NYLON ETHILON 5-0 P-3 1X18 (SUTURE) IMPLANT
SUT PLAIN 6 0 TG1408 (SUTURE) IMPLANT
SUT POLY NON ABSORB 10-0 8 STR (SUTURE) IMPLANT
SUT VICRYL 6 0 S 29 12 (SUTURE) IMPLANT
SUTURE EHLN 10-0 CS-B-6CS-B-6 (SUTURE) IMPLANT
SYR 20CC LL (SYRINGE) IMPLANT
SYR 3ML LL SCALE MARK (SYRINGE) ×2 IMPLANT
SYR TB 1ML LUER SLIP (SYRINGE) ×2 IMPLANT
SYRINGE 10CC LL (SYRINGE) IMPLANT
TAPE PAPER MEDFIX 1IN X 10YD (GAUZE/BANDAGES/DRESSINGS) ×2 IMPLANT
TIP ABS 45DEG FLARED 0.9MM (TIP) ×2 IMPLANT
TOWEL OR 17X24 6PK STRL BLUE (TOWEL DISPOSABLE) ×4 IMPLANT
WATER STERILE IRR 1000ML POUR (IV SOLUTION) ×2 IMPLANT
WIPE INSTRUMENT ADHESIVE BACK (MISCELLANEOUS) ×2 IMPLANT
WIPE INSTRUMENT VISIWIPE 73X73 (MISCELLANEOUS) ×2 IMPLANT

## 2012-12-12 NOTE — Anesthesia Postprocedure Evaluation (Signed)
Anesthesia Post Note  Patient: Louis Harris  Procedure(s) Performed: Procedure(s) (LRB): CATARACT EXTRACTION PHACO AND INTRAOCULAR LENS PLACEMENT (IOC) (Left)  Anesthesia type: MAC  Patient location: PACU  Post pain: Pain level controlled  Post assessment: Patient's Cardiovascular Status Stable  Last Vitals:  Filed Vitals:   12/12/12 1445  BP: 135/77  Pulse: 78  Temp: 36.7 C  Resp: 20    Post vital signs: Reviewed and stable  Level of consciousness: sedated  Complications: No apparent anesthesia complications

## 2012-12-12 NOTE — Transfer of Care (Signed)
Immediate Anesthesia Transfer of Care Note  Patient: Louis Harris  Procedure(s) Performed: Procedure(s): CATARACT EXTRACTION PHACO AND INTRAOCULAR LENS PLACEMENT (IOC) (Left)  Patient Location: PACU  Anesthesia Type:MAC  Level of Consciousness: awake, alert  and oriented  Airway & Oxygen Therapy: Patient Spontanous Breathing  Post-op Assessment: Report given to PACU RN, Post -op Vital signs reviewed and stable and Patient moving all extremities X 4  Post vital signs: Reviewed and stable  Complications: No apparent anesthesia complications

## 2012-12-12 NOTE — Op Note (Signed)
Torrey Ballinas 12/12/2012 Cataract: Combined, Nuclear  Procedure: Phacoemulsification, Posterior Chamber Intra-ocular Lens Operative Eye:  right eye  Surgeon: Shade Flood Estimated Blood Loss: minimal Specimens for Pathology:  None Complications: none  The patient was prepared and draped in the usual manner for ocular surgery on the right eye. A Cook lid speculum was placed. A peripheral clear corneal incision was made at the surgical limbus centered at the 11:00 meridian. A separate clear corneal stab incision was made with a 15 degree blade at the 2:00 meridian to permit bi-manual technique. Viscoat and  Provisc as an underlying layer next to the capsule was instilled into the anterior chamber through that incision.  A keratome was used to create a self sealing incision entering the anterior chamber at the 11:00 meridian. A capsulorhexis was performed using a bent 25g needle. The lens was hydrodissected and the nucleus was hydrodilineated using a Nichammin cannula. The Chang chopper was inserted and used to rotate the lens to insure adequate lens mobility. The phacoemulsification handpiece was inserted and a combined phaco-chop technique was employed, fracturing the lens into separate sections with subsequent removal with the phaco handpiece.   The I/A cannula was used to remove remaining lens cortex. Provisc was instilled and used to deepen the anterior chamber and posterior capsule bag. The Monarch injector was used to place a folded Acrysof [id number] PC IOL, + 21.00  diopters, into the capsule bag. A Sinskey lens hook was used to dial in the trailing haptic.  The I/A cannula was used to remove the viscoelastic from the anterior chamber. BSS was used to bring IOP to the desired range and the wound was checked to insure it was watertight. Subconjunctival injections of Ancef 100/0.42ml and Dexamethasone 0.5 ml of a 10mg /55ml solution were placed without complication. The lid speculum and drapes were  removed and the patient's eye was patched with Polymixin/Bacitracin ophthalmic ointment. An eye shield was placed and the patient was transferred alert and conversant from the operating room to the post-operative recovery area.   Shade Flood, MD

## 2012-12-12 NOTE — Interval H&P Note (Signed)
History and Physical Interval Note:  12/12/2012 11:49 AM  Louis Harris  has presented today for surgery, with the diagnosis of Nuclear Sxclerotic Cataract OS  The various methods of treatment have been discussed with the patient and family. After consideration of risks, benefits and other options for treatment, the patient has consented to  Procedure(s): CATARACT EXTRACTION PHACO AND INTRAOCULAR LENS PLACEMENT (IOC) (Left) as a surgical intervention .  The patient's history has been reviewed, patient examined, no change in status, stable for surgery.  I have reviewed the patient's chart and labs.  Questions were answered to the patient's satisfaction.     Ansley Mangiapane, Waynette Buttery

## 2012-12-12 NOTE — Preoperative (Signed)
Beta Blockers   Reason not to administer Beta Blockers:Not Applicable 

## 2012-12-12 NOTE — Anesthesia Preprocedure Evaluation (Signed)
Anesthesia Evaluation  Patient identified by MRN, date of birth, ID band Patient awake    Reviewed: Allergy & Precautions, H&P , NPO status , Patient's Chart, lab work & pertinent test results  History of Anesthesia Complications Negative for: history of anesthetic complications  Airway Mallampati: II TM Distance: >3 FB Neck ROM: Full    Dental  (+) Teeth Intact and Dental Advisory Given   Pulmonary neg pulmonary ROS,    Pulmonary exam normal       Cardiovascular negative cardio ROS      Neuro/Psych PSYCHIATRIC DISORDERS Anxiety Depression negative neurological ROS     GI/Hepatic Neg liver ROS, GERD-  Medicated,  Endo/Other  negative endocrine ROS  Renal/GU negative Renal ROS     Musculoskeletal   Abdominal   Peds  Hematology   Anesthesia Other Findings   Reproductive/Obstetrics                           Anesthesia Physical Anesthesia Plan  ASA: III  Anesthesia Plan: MAC   Post-op Pain Management:    Induction: Intravenous  Airway Management Planned: Nasal Cannula  Additional Equipment:   Intra-op Plan:   Post-operative Plan: Extubation in OR  Informed Consent: I have reviewed the patients History and Physical, chart, labs and discussed the procedure including the risks, benefits and alternatives for the proposed anesthesia with the patient or authorized representative who has indicated his/her understanding and acceptance.   Dental advisory given  Plan Discussed with: CRNA, Anesthesiologist and Surgeon  Anesthesia Plan Comments:         Anesthesia Quick Evaluation

## 2012-12-12 NOTE — Anesthesia Procedure Notes (Signed)
Procedure Name: MAC Date/Time: 12/12/2012 1:49 PM Performed by: Marena Chancy Pre-anesthesia Checklist: Patient identified, Patient being monitored, Emergency Drugs available, Timeout performed and Suction available Patient Re-evaluated:Patient Re-evaluated prior to inductionOxygen Delivery Method: Nasal cannula Intubation Type: IV induction Placement Confirmation: positive ETCO2

## 2012-12-12 NOTE — H&P (View-Only) (Signed)
Patient Record  Louis Harris, Louis Harris  Patient Number:  [id number] Date of Birth:  August 15, 1934 Age:  78 years old    Gender:  Male Date of Evaluation:  November 16, 2012  Chief Complaint:   78 year old male complaining of decreased vision. He is referred for Cataract evaluation. He also complains of a left lateral canthal cyst which is causing persistent irrtitation OS. History of Present Illness:   Follow up dry eye syndrome.  Has cataracts.  Does not see as clearly as h e  has been seeing.  C/O bump lateral canthus os.  Cyst painful at times.  Has stinging and itching.  On systane.  Has used systane.  Uses them about two-three times a day.  No pain or discomfort.  Has occaisional pain os.  Pain usually relieved by hot towels.  On I Caps for macular Degeneration.  Presents for evaluation. Past History:  Allergies:  Darvon (propoxyphene) (unspecified), Active Medications:   Ocular Medications:  Systane (peg 400-propylene glycol) Drops 0.4-0.3% Other Medications:  gabapentin (gabapentin) by Thomas  Brewington, capsule 100 mg, Mirapex (pramipexole) by Thomas  Brewington, tablet 0.125 mg, pantoprazole (pantoprazole) by Thomas  Brewington, tablet,delayed release (DR/EC) 40 mg, aspirin (aspirin) tablet,delayed release (DR/EC) 81 mg, Cardura (doxazosin) tablet 4 mg 1 tablet by mouth every night tablet, Proscar (finasteride) tablet 5 mg 1 tablet by mouth every morning tablet, Systane (peg 400-propylene glycol) drops 0.4-0.3% Birth History:  none Past Ocular History:   cataract   dry eye syndrome  macular degeneration Past Medical History:  prostate cancer  Past Surgical History:  replace disk  hernia repair  Family History:  no amblyopia, no blindness, + cataracts (grandmother), no crossed eyes, no diabetic retinopathy, no glaucoma, no macular degeneration, no retinal detachment, + cancer (uncle), no diabetes, + heart disease (aunt), no high blood pressure, no stroke Social History:   Smoking Status: never  smoker  Alcohol:  none  Driving status:  driving  Marital status:  married Review of Systems:   Eyes: + dryness  All other systems are negative.  Examination:  Visual Acuity:   Distance VA cc:  OD: 20/40-1    OS: 20/40  Distance VA [place]:  OD: 20/60    OS: 20/60 IOP:  OD:  17     OS:  19    @ 01:19PM (Goldmann applanation) Manifest Refraction:    Sphere    Cyl Axis       VA         Add       VA Prism Base R:  +1.50  -1.00   95    20/40       +2.75                      L:  +1.75  -1.00   85    20/50       +2.75                        Confrontation visual field:  OU:  Normal  Motility:  OU:  Normal  Pupils:  OU:  Shape, size, direct and consensual reaction normal  Adnexa:  L:  tenderness in upper medial orbit to touch with cotton tipped applicator. Preauricular LN, lacrimal drainage, lacrimal glands, orbit normal  Eyelids:  R:  normal   L:  large cyst lateral canthus  Conjunctiva: OU:  bulbar, palpebral normal  Cornea: OU:  epithelium, stroma, endothelium,   tear film normal  Anterior Chamber: OU:  depth normal, no cell, no flare  Iris: OU:  normal Dilation:  OU: AK-Dilate, Tropicacyl @ 03:21PM  Lens:  OD:  2+ nuclear sclerotic cataract OS:  3+ centrally dense  nuclear sclerotic cataract  Vitreous:  OD:  normal OS:  PVD with operculum OS  Optic Disc: OD:  cupping: 0.6 x 0.55  OS:  cupping: 0.7 x 0.65  Macula:  OU:  questionable RPE defect ou; refractile floater over macula os  Vessels: OU:  normal  Periphery: OU:  normal  Orientation to person, place and time:  Normal  Mood and affect:  Normal  Impression:  366.16  Nuclear Sclerotic Cataract OS>OD 362.51  Macular Degeneration, dry OU: stable 370.33  Dry eye, keratoconjunctivitis sicca OU -  -recommended art tears at least QID, prn 374.84  Cyst of left eyelid margin  Plan/Treatment:  Cataract: We discussed the natural history of Cataracts with illustrations. We discussed the related symptoms , visual significance and  when we intervene with surgery. We discussed the surgical techniques used, risks and benefits of surgery.  I have recommended proceeding with Phaco IOL in the left eye. He has a fluid filled large lateral canthal cyst OS. We will remove it at the same time as it is causing irritation.  He indicated understanding our discussion and felt that his questions had been answered to his satisfaction.  He indicates that he desires to proceed with the recommended treatment/care plan.   Medications:   Continue Systane gtts OS Q4-6 hrs WA Patient Instructions: Please do not eat anything after mignight the day before surgery. Return to clinic:  RV October 30th for post-operative follow-up   (electronically signed) Santos Sollenberger, MD   

## 2012-12-13 ENCOUNTER — Encounter (HOSPITAL_COMMUNITY): Payer: Self-pay | Admitting: Ophthalmology

## 2013-09-06 ENCOUNTER — Encounter: Payer: Self-pay | Admitting: Cardiology

## 2013-09-06 DIAGNOSIS — K219 Gastro-esophageal reflux disease without esophagitis: Secondary | ICD-10-CM | POA: Insufficient documentation

## 2013-09-06 DIAGNOSIS — F32A Depression, unspecified: Secondary | ICD-10-CM | POA: Insufficient documentation

## 2013-09-06 DIAGNOSIS — M1990 Unspecified osteoarthritis, unspecified site: Secondary | ICD-10-CM | POA: Insufficient documentation

## 2013-09-06 DIAGNOSIS — F329 Major depressive disorder, single episode, unspecified: Secondary | ICD-10-CM | POA: Insufficient documentation

## 2013-09-06 DIAGNOSIS — F419 Anxiety disorder, unspecified: Secondary | ICD-10-CM | POA: Insufficient documentation

## 2014-01-22 ENCOUNTER — Encounter (HOSPITAL_COMMUNITY): Payer: Self-pay | Admitting: Cardiology

## 2015-10-26 ENCOUNTER — Encounter: Payer: Self-pay | Admitting: Nurse Practitioner

## 2015-10-26 ENCOUNTER — Encounter (INDEPENDENT_AMBULATORY_CARE_PROVIDER_SITE_OTHER): Payer: Medicare Other | Admitting: Nurse Practitioner

## 2015-10-26 NOTE — Progress Notes (Signed)
Pre visit review using our clinic review tool, if applicable. No additional management support is needed unless otherwise documented below in the visit note. 

## 2015-10-29 NOTE — Progress Notes (Signed)
This encounter was created in error - please disregard.

## 2015-11-18 ENCOUNTER — Ambulatory Visit: Payer: Medicare Other | Admitting: Nurse Practitioner

## 2015-12-15 ENCOUNTER — Encounter: Payer: Self-pay | Admitting: Nurse Practitioner

## 2015-12-15 ENCOUNTER — Encounter: Payer: Medicare Other | Admitting: Nurse Practitioner

## 2015-12-15 NOTE — Progress Notes (Signed)
This encounter was created in error - please disregard.

## 2015-12-15 NOTE — Progress Notes (Signed)
Pre visit review using our clinic review tool, if applicable. No additional management support is needed unless otherwise documented below in the visit note. 

## 2016-02-28 ENCOUNTER — Encounter (HOSPITAL_COMMUNITY): Payer: Self-pay | Admitting: Emergency Medicine

## 2016-02-28 ENCOUNTER — Emergency Department (HOSPITAL_COMMUNITY)
Admission: EM | Admit: 2016-02-28 | Discharge: 2016-02-28 | Disposition: A | Discharge status: Home / Self Care | Payer: Medicare Other | Attending: Emergency Medicine | Admitting: Emergency Medicine

## 2016-02-28 ENCOUNTER — Emergency Department (HOSPITAL_COMMUNITY): Payer: Medicare Other

## 2016-02-28 DIAGNOSIS — Z79899 Other long term (current) drug therapy: Secondary | ICD-10-CM | POA: Diagnosis not present

## 2016-02-28 DIAGNOSIS — J111 Influenza due to unidentified influenza virus with other respiratory manifestations: Secondary | ICD-10-CM | POA: Diagnosis not present

## 2016-02-28 DIAGNOSIS — Z7982 Long term (current) use of aspirin: Secondary | ICD-10-CM | POA: Insufficient documentation

## 2016-02-28 DIAGNOSIS — R509 Fever, unspecified: Secondary | ICD-10-CM | POA: Diagnosis present

## 2016-02-28 LAB — CBC WITH DIFFERENTIAL/PLATELET
BASOS ABS: 0 10*3/uL (ref 0.0–0.1)
Basophils Relative: 0 %
Eosinophils Absolute: 0 10*3/uL (ref 0.0–0.7)
Eosinophils Relative: 0 %
HEMATOCRIT: 42.4 % (ref 39.0–52.0)
Hemoglobin: 14.3 g/dL (ref 13.0–17.0)
LYMPHS PCT: 15 %
Lymphs Abs: 0.7 10*3/uL (ref 0.7–4.0)
MCH: 31.1 pg (ref 26.0–34.0)
MCHC: 33.7 g/dL (ref 30.0–36.0)
MCV: 92.2 fL (ref 78.0–100.0)
Monocytes Absolute: 0.3 10*3/uL (ref 0.1–1.0)
Monocytes Relative: 7 %
NEUTROS ABS: 3.5 10*3/uL (ref 1.7–7.7)
Neutrophils Relative %: 79 %
Platelets: 93 10*3/uL — ABNORMAL LOW (ref 150–400)
RBC: 4.6 MIL/uL (ref 4.22–5.81)
RDW: 14.8 % (ref 11.5–15.5)
WBC: 4.5 10*3/uL (ref 4.0–10.5)

## 2016-02-28 LAB — URINALYSIS, ROUTINE W REFLEX MICROSCOPIC
Bilirubin Urine: NEGATIVE
GLUCOSE, UA: NEGATIVE mg/dL
Ketones, ur: 5 mg/dL — AB
Leukocytes, UA: NEGATIVE
Nitrite: NEGATIVE
PH: 5 (ref 5.0–8.0)
Protein, ur: 100 mg/dL — AB
Specific Gravity, Urine: 1.025 (ref 1.005–1.030)
Squamous Epithelial / HPF: NONE SEEN

## 2016-02-28 LAB — COMPREHENSIVE METABOLIC PANEL
ALT: 31 U/L (ref 17–63)
ANION GAP: 9 (ref 5–15)
AST: 30 U/L (ref 15–41)
Albumin: 3.5 g/dL (ref 3.5–5.0)
Alkaline Phosphatase: 50 U/L (ref 38–126)
BUN: 12 mg/dL (ref 6–20)
CALCIUM: 8.7 mg/dL — AB (ref 8.9–10.3)
CHLORIDE: 106 mmol/L (ref 101–111)
CO2: 24 mmol/L (ref 22–32)
CREATININE: 1.3 mg/dL — AB (ref 0.61–1.24)
GFR calc non Af Amer: 51 mL/min — ABNORMAL LOW (ref >60)
GFR, EST AFRICAN AMERICAN: 59 mL/min — AB (ref >60)
Glucose, Bld: 115 mg/dL — ABNORMAL HIGH (ref 65–99)
Potassium: 4.1 mmol/L (ref 3.5–5.1)
Sodium: 139 mmol/L (ref 135–145)
Total Bilirubin: 0.5 mg/dL (ref 0.3–1.2)
Total Protein: 6.4 g/dL — ABNORMAL LOW (ref 6.5–8.1)

## 2016-02-28 LAB — CBG MONITORING, ED: GLUCOSE-CAPILLARY: 124 mg/dL — AB (ref 65–99)

## 2016-02-28 LAB — INFLUENZA PANEL BY PCR (TYPE A & B)
Influenza A By PCR: POSITIVE — AB
Influenza B By PCR: NEGATIVE

## 2016-02-28 LAB — I-STAT CG4 LACTIC ACID, ED: Lactic Acid, Venous: 1.48 mmol/L (ref 0.5–1.9)

## 2016-02-28 MED ORDER — ACETAMINOPHEN 500 MG PO TABS: 1000.0000 mg | ORAL_TABLET | Freq: Four times a day (QID) | ORAL | 0 refills | Status: DC | PRN

## 2016-02-28 MED ORDER — ACETAMINOPHEN 500 MG PO TABS
1000.0000 mg | ORAL_TABLET | Freq: Once | ORAL | Status: AC
  Administered 2016-02-28: 1000 mg via ORAL
  Filled 2016-02-28: qty 2

## 2016-02-28 MED ORDER — OSELTAMIVIR PHOSPHATE 75 MG PO CAPS: 75.0000 mg | ORAL_CAPSULE | Freq: Two times a day (BID) | ORAL | 0 refills | Status: DC

## 2016-02-28 MED ORDER — SODIUM CHLORIDE 0.9 % IV SOLN
Freq: Once | INTRAVENOUS | Status: AC
  Administered 2016-02-28: 20:00:00 via INTRAVENOUS

## 2016-02-28 MED ORDER — OSELTAMIVIR PHOSPHATE 30 MG PO CAPS
30.0000 mg | ORAL_CAPSULE | Freq: Once | ORAL | Status: AC
  Administered 2016-02-28: 30 mg via ORAL
  Filled 2016-02-28: qty 1

## 2016-02-28 NOTE — ED Triage Notes (Signed)
Pt arrived from home via EMS after reports from Pt wife that he has had increase in altered mental status over the last 24 hours. Per EMS pt was recently diagnosed with alzheimers, and his wife states that he has had dark urine and painful urination recently.

## 2016-02-28 NOTE — ED Provider Notes (Signed)
[facility]-EMERGENCY DEPT Provider Note   CSN: [id number] Arrival date & time: 02/28/16  1916     History   Chief Complaint Chief Complaint  Patient presents with  . Altered Mental Status  . Fever    HPI Louis Harris is a 82 y.o. male.  HPI The patient's wife reports that they were at the [facility] having a sleep study yesterday. They arrived there at 7:30 in the morning and were discharged at 6 AM this morning. The patient's wife reports that all day today he is seemed confused and was somewhat disoriented. He has been very weak and required extensive assistance. Patient is denying focal pain but she does report that earlier in the day he complained of aching all over. There has been small amount of coughing. Patient is denying abdominal pain, dysuria or urgency. No chest pain. His wife reports that he was so weak earlier in the day that she was trying to assist him on the toilet and he fell back onto the toilet but did not injure himself. No vomiting or diarrhea. Decreased oral intake today. Patient was diagnosed with Alzheimer's disease this fall. Past Medical History:  Diagnosis Date  . Anxiety   . Arthritis   . Depression   . GERD (gastroesophageal reflux disease)   . Shortness of breath    exertion    Patient Active Problem List   Diagnosis Date Noted  . Anxiety   . Depression   . GERD (gastroesophageal reflux disease)   . Arthritis   . Shortness of breath 01/21/2011  . Pure hypercholesterolemia 01/21/2011    Past Surgical History:  Procedure Laterality Date  . CATARACT EXTRACTION W/PHACO Left 12/12/2012   Procedure: CATARACT EXTRACTION PHACO AND INTRAOCULAR LENS PLACEMENT (IOC);  Surgeon: Shade Flood, MD;  Location: [facility] OR;  Service: Ophthalmology;  Laterality: Left;  . CERVICAL SPINE SURGERY    . LEFT HEART CATHETERIZATION WITH CORONARY ANGIOGRAM N/A 01/21/2011   Procedure: LEFT HEART CATHETERIZATION WITH CORONARY ANGIOGRAM;  Surgeon: Donato Schultz, MD;  Location: [facility]  CATH LAB;  Service: Cardiovascular;  Laterality: N/A;       Home Medications    Prior to Admission medications   Medication Sig Start Date End Date Taking? Authorizing Provider  albuterol (PROVENTIL HFA;VENTOLIN HFA) 108 (90 BASE) MCG/ACT inhaler Inhale 2 puffs into the lungs every 6 (six) hours as needed for wheezing.   Yes Historical Provider, MD  aspirin EC 81 MG tablet Take 81 mg by mouth daily.     Yes Historical Provider, MD  calcium citrate-vitamin D (CITRACAL+D) 315-200 MG-UNIT tablet Take 1 tablet by mouth daily.    Yes Historical Provider, MD  calcium gluconate 500 MG tablet Take 1 tablet by mouth daily.    Yes Historical Provider, MD  donepezil (ARICEPT) 10 MG tablet Take 10 mg by mouth at bedtime.  10/23/15  Yes Historical Provider, MD  doxazosin (CARDURA) 4 MG tablet Take 4 mg by mouth at bedtime.     Yes Historical Provider, MD  finasteride (PROSCAR) 5 MG tablet Take 5 mg by mouth daily.   Yes Historical Provider, MD  fish oil-omega-3 fatty acids 1000 MG capsule Take 1 g by mouth daily.     Yes Historical Provider, MD  gabapentin (NEURONTIN) 100 MG capsule Take 300 mg by mouth at bedtime.     Yes Historical Provider, MD  ibuprofen (ADVIL,MOTRIN) 200 MG tablet Take 200-600 mg by mouth every 6 (six) hours as needed for pain.    Yes Historical  Provider, MD  ketorolac (ACULAR) 0.5 % ophthalmic solution Place 1 drop into the right eye 4 (four) times daily.   Yes Historical Provider, MD  meloxicam (MOBIC) 15 MG tablet Take 15 mg by mouth at bedtime.   Yes Historical Provider, MD  moxifloxacin (VIGAMOX) 0.5 % ophthalmic solution Place 1 drop into the right eye 4 (four) times daily.   Yes Historical Provider, MD  Multiple Vitamins-Minerals (ICAPS PO) Take 1 tablet by mouth 2 (two) times daily.   Yes Historical Provider, MD  Multiple Vitamins-Minerals (MULTIVITAMINS THER. W/MINERALS) TABS Take 1 tablet by mouth daily.     Yes Historical Provider, MD  pantoprazole (PROTONIX) 40 MG tablet  Take 40 mg by mouth daily.   Yes Historical Provider, MD  pramipexole (MIRAPEX) 0.125 MG tablet Take 0.25 mg by mouth at bedtime.   Yes Historical Provider, MD  prednisoLONE acetate (PRED FORTE) 1 % ophthalmic suspension Place 1 drop into the right eye 4 (four) times daily.   Yes Historical Provider, MD  acetaminophen (TYLENOL) 500 MG tablet Take 2 tablets (1,000 mg total) by mouth every 6 (six) hours as needed. 02/28/16   Arby BarretteMarcy Orlandis Sanden, MD  oseltamivir (TAMIFLU) 75 MG capsule Take 1 capsule (75 mg total) by mouth every 12 (twelve) hours. 02/28/16   Arby BarretteMarcy Palmer Shorey, MD    Family History Family History  Problem Relation Age of Onset  . Heart attack Father   . Lupus Sister     Social History Social History  Substance Use Topics  . Smoking status: Never Smoker  . Smokeless tobacco: Never Used  . Alcohol use No     Allergies   Diovan [valsartan] and Prilosec [omeprazole]   Review of Systems Review of Systems  10 Systems reviewed and are negative for acute change except as noted in the HPI.  Physical Exam Updated Vital Signs BP 105/64   Pulse (!) 55   Temp 98.4 F (36.9 C) (Oral)   Resp 14   Ht 5\' 5"  (1.651 m)   Wt 177 lb 0.5 oz (80.3 kg)   SpO2 91%   BMI 29.46 kg/m   Physical Exam  Constitutional: He appears well-developed and well-nourished. No distress.  Patient does not have respiratory distress. He does seem to have some difficulty with recall and is delayed in giving answers.  HENT:  Head: Normocephalic and atraumatic.  Mouth/Throat: Oropharynx is clear and moist.  Eyes: Conjunctivae and EOM are normal. Pupils are equal, round, and reactive to light.  Neck: Neck supple.  Cardiovascular: Normal rate and regular rhythm.   No murmur heard. Pulmonary/Chest: Effort normal and breath sounds normal. No respiratory distress.  Abdominal: Soft. There is no tenderness.  Musculoskeletal: He exhibits no edema, tenderness or deformity.  Neurological: He is alert. He  exhibits normal muscle tone. Coordination normal.  Patient is alert but he is a poor historian. Speech is not slurred but content is drifting. Patient does follow commands appropriately. No focal motor deficit.  Skin: Skin is warm and dry. No rash noted.  Psychiatric: He has a normal mood and affect.  Nursing note and vitals reviewed.    ED Treatments / Results  Labs (all labs ordered are listed, but only abnormal results are displayed) Labs Reviewed  COMPREHENSIVE METABOLIC PANEL - Abnormal; Notable for the following:       Result Value   Glucose, Bld 115 (*)    Creatinine, Ser 1.30 (*)    Calcium 8.7 (*)    Total Protein 6.4 (*)  GFR calc non Af Amer 51 (*)    GFR calc Af Amer 59 (*)    All other components within normal limits  URINALYSIS, ROUTINE W REFLEX MICROSCOPIC - Abnormal; Notable for the following:    APPearance HAZY (*)    Hgb urine dipstick MODERATE (*)    Ketones, ur 5 (*)    Protein, ur 100 (*)    Bacteria, UA RARE (*)    All other components within normal limits  INFLUENZA PANEL BY PCR (TYPE A & B, H1N1) - Abnormal; Notable for the following:    Influenza A By PCR POSITIVE (*)    All other components within normal limits  CBC WITH DIFFERENTIAL/PLATELET - Abnormal; Notable for the following:    Platelets 93 (*)    All other components within normal limits  CBG MONITORING, ED - Abnormal; Notable for the following:    Glucose-Capillary 124 (*)    All other components within normal limits  I-STAT CG4 LACTIC ACID, ED    EKG  EKG Interpretation None       Radiology Dg Chest 2 View  Result Date: 02/28/2016 CLINICAL DATA:  Fever.  Increased altered mental status. EXAM: CHEST  2 VIEW COMPARISON:  12/05/2012 FINDINGS: Lower lung volumes from prior exam. Unchanged heart size and mediastinal contours allowing for differences in technique. Retrocardiac hiatal hernia, likely increased in size from prior exam. Bibasilar opacities, left greater than right, favored  atelectasis. Trace blunting of both costophrenic angles as well as minimal fluid in the fissures. Density posteriorly on the lateral view felt to be secondary to hiatal hernia. No pulmonary edema or pneumothorax. No acute osseous abnormalities are seen. IMPRESSION: 1. Streaky bibasilar opacities, favoring atelectasis. 2. Retrocardiac hiatal hernia, likely increased in size from 2014 exam. 3. Possible tiny pleural effusions. Electronically Signed   By: Rubye Oaks M.D.   On: 02/28/2016 20:59    Procedures Procedures (including critical care time)  Medications Ordered in ED Medications  oseltamivir (TAMIFLU) capsule 30 mg (not administered)  acetaminophen (TYLENOL) tablet 1,000 mg (1,000 mg Oral Given 02/28/16 2000)  0.9 %  sodium chloride infusion ( Intravenous New Bag/Given 02/28/16 2000)     Initial Impression / Assessment and Plan / ED Course  I have reviewed the triage vital signs and the nursing notes.  Pertinent labs & imaging results that were available during my care of the patient were reviewed by me and considered in my medical decision making (see chart for details).  Clinical Course     Final Clinical Impressions(s) / ED Diagnoses   Final diagnoses:  Influenza  Patient has influenza. Symptoms just starting today. Diagnostic workup otherwise negative. Patient has been ambulated and does not have respiratory distress. Plan will be for ongoing care at home at this time. Precautionary return instructions are provided.  New Prescriptions New Prescriptions   ACETAMINOPHEN (TYLENOL) 500 MG TABLET    Take 2 tablets (1,000 mg total) by mouth every 6 (six) hours as needed.   OSELTAMIVIR (TAMIFLU) 75 MG CAPSULE    Take 1 capsule (75 mg total) by mouth every 12 (twelve) hours.     Arby Barrette, MD 02/28/16 [phone]

## 2016-02-28 NOTE — ED Notes (Addendum)
Pt ambulated in hallway and in room approx 6870ft. Tolerated well. Per pt wife he ambulates at baseline and is ok to go home.

## 2016-02-28 NOTE — ED Notes (Signed)
Patient transported to X-ray 

## 2016-02-28 NOTE — ED Notes (Signed)
CBG 124 

## 2016-07-21 ENCOUNTER — Ambulatory Visit (INDEPENDENT_AMBULATORY_CARE_PROVIDER_SITE_OTHER): Payer: Medicare Other | Admitting: Podiatry

## 2016-07-21 ENCOUNTER — Ambulatory Visit (INDEPENDENT_AMBULATORY_CARE_PROVIDER_SITE_OTHER): Payer: Medicare Other

## 2016-07-21 DIAGNOSIS — M79671 Pain in right foot: Secondary | ICD-10-CM | POA: Diagnosis not present

## 2016-07-21 DIAGNOSIS — M779 Enthesopathy, unspecified: Secondary | ICD-10-CM | POA: Diagnosis not present

## 2016-07-21 DIAGNOSIS — M79672 Pain in left foot: Principal | ICD-10-CM

## 2016-07-22 NOTE — Progress Notes (Signed)
Subjective:    Patient ID: Louis Harris, male   DOB: 82 y.o.   MRN: [id number]   HPI patient states he's had foot pain bilateral for a long time and has had orthotics which have worn out but isn't been helpful    Review of Systems  All other systems reviewed and are negative.       Objective:  Physical Exam  Cardiovascular: Intact distal pulses.   Musculoskeletal: Normal range of motion.  Neurological: He is alert.  Skin: Skin is warm.  Nursing note and vitals reviewed.  neurovascular status intact muscle strength adequate with patient noted to have moderate tendinitis-like symptoms bilateral localized and was found have good digital perfusion and well oriented 3     Assessment:    Inflammatory tendinitis secondary to foot structure     Plan:    H&P condition reviewed and discussed. At this point I recommended orthotics and he is scanned for customized orthotic devices to reduce plantar pressure

## 2016-08-11 ENCOUNTER — Other Ambulatory Visit: Payer: Medicare Other | Admitting: Orthotics

## 2016-09-08 ENCOUNTER — Ambulatory Visit: Payer: Medicare Other | Admitting: Orthotics

## 2016-09-08 DIAGNOSIS — M79671 Pain in right foot: Secondary | ICD-10-CM

## 2016-09-08 DIAGNOSIS — M79672 Pain in left foot: Principal | ICD-10-CM

## 2016-09-08 NOTE — Progress Notes (Signed)
Patient not satisfied with refurbished F/O..will send to [facility] to add 1/16 PPT padding..Marland Kitchen

## 2016-09-22 ENCOUNTER — Ambulatory Visit (INDEPENDENT_AMBULATORY_CARE_PROVIDER_SITE_OTHER): Payer: Medicare Other | Admitting: Orthotics

## 2016-09-22 DIAGNOSIS — M79671 Pain in right foot: Secondary | ICD-10-CM

## 2016-09-22 DIAGNOSIS — M779 Enthesopathy, unspecified: Secondary | ICD-10-CM

## 2016-09-22 DIAGNOSIS — M79672 Pain in left foot: Secondary | ICD-10-CM

## 2016-09-22 NOTE — Progress Notes (Signed)
Patient p/u F/O recovered. No charge

## 2016-12-09 ENCOUNTER — Encounter: Payer: Self-pay | Admitting: *Deleted

## 2016-12-20 ENCOUNTER — Other Ambulatory Visit: Payer: Self-pay

## 2016-12-20 ENCOUNTER — Encounter (HOSPITAL_COMMUNITY): Payer: Self-pay

## 2016-12-20 ENCOUNTER — Emergency Department (HOSPITAL_COMMUNITY): Payer: Medicare Other

## 2016-12-20 ENCOUNTER — Emergency Department (HOSPITAL_COMMUNITY)
Admission: EM | Admit: 2016-12-20 | Discharge: 2016-12-20 | Disposition: A | Discharge status: Home / Self Care | Payer: Medicare Other | Attending: Emergency Medicine | Admitting: Emergency Medicine

## 2016-12-20 DIAGNOSIS — F329 Major depressive disorder, single episode, unspecified: Secondary | ICD-10-CM | POA: Diagnosis not present

## 2016-12-20 DIAGNOSIS — R1013 Epigastric pain: Secondary | ICD-10-CM | POA: Insufficient documentation

## 2016-12-20 DIAGNOSIS — Z7982 Long term (current) use of aspirin: Secondary | ICD-10-CM | POA: Insufficient documentation

## 2016-12-20 DIAGNOSIS — Z79899 Other long term (current) drug therapy: Secondary | ICD-10-CM | POA: Diagnosis not present

## 2016-12-20 DIAGNOSIS — R55 Syncope and collapse: Secondary | ICD-10-CM | POA: Diagnosis present

## 2016-12-20 DIAGNOSIS — F419 Anxiety disorder, unspecified: Secondary | ICD-10-CM | POA: Insufficient documentation

## 2016-12-20 LAB — CBC WITH DIFFERENTIAL/PLATELET
BASOS ABS: 0 10*3/uL (ref 0.0–0.1)
Basophils Relative: 0 %
Eosinophils Absolute: 0 10*3/uL (ref 0.0–0.7)
Eosinophils Relative: 0 %
HCT: 38.9 % — ABNORMAL LOW (ref 39.0–52.0)
Hemoglobin: 13 g/dL (ref 13.0–17.0)
Lymphocytes Relative: 15 %
Lymphs Abs: 0.6 10*3/uL — ABNORMAL LOW (ref 0.7–4.0)
MCH: 30.4 pg (ref 26.0–34.0)
MCHC: 33.4 g/dL (ref 30.0–36.0)
MCV: 90.9 fL (ref 78.0–100.0)
Monocytes Absolute: 0.3 10*3/uL (ref 0.1–1.0)
Monocytes Relative: 8 %
NEUTROS PCT: 76 %
Neutro Abs: 2.8 10*3/uL (ref 1.7–7.7)
PLATELETS: 104 10*3/uL — AB (ref 150–400)
RBC: 4.28 MIL/uL (ref 4.22–5.81)
RDW: 14.1 % (ref 11.5–15.5)
WBC: 3.7 10*3/uL — AB (ref 4.0–10.5)

## 2016-12-20 LAB — COMPREHENSIVE METABOLIC PANEL
ALT: 16 U/L — ABNORMAL LOW (ref 17–63)
ANION GAP: 5 (ref 5–15)
AST: 19 U/L (ref 15–41)
Albumin: 3.6 g/dL (ref 3.5–5.0)
Alkaline Phosphatase: 53 U/L (ref 38–126)
BUN: 10 mg/dL (ref 6–20)
CHLORIDE: 111 mmol/L (ref 101–111)
CO2: 24 mmol/L (ref 22–32)
Calcium: 8.9 mg/dL (ref 8.9–10.3)
Creatinine, Ser: 1.03 mg/dL (ref 0.61–1.24)
Glucose, Bld: 102 mg/dL — ABNORMAL HIGH (ref 65–99)
POTASSIUM: 3.8 mmol/L (ref 3.5–5.1)
Sodium: 140 mmol/L (ref 135–145)
Total Bilirubin: 1 mg/dL (ref 0.3–1.2)
Total Protein: 6 g/dL — ABNORMAL LOW (ref 6.5–8.1)

## 2016-12-20 LAB — I-STAT CHEM 8, ED
BUN: 12 mg/dL (ref 6–20)
CHLORIDE: 107 mmol/L (ref 101–111)
Calcium, Ion: 1.16 mmol/L (ref 1.15–1.40)
Creatinine, Ser: 1 mg/dL (ref 0.61–1.24)
Glucose, Bld: 103 mg/dL — ABNORMAL HIGH (ref 65–99)
HEMATOCRIT: 40 % (ref 39.0–52.0)
HEMOGLOBIN: 13.6 g/dL (ref 13.0–17.0)
POTASSIUM: 3.8 mmol/L (ref 3.5–5.1)
SODIUM: 142 mmol/L (ref 135–145)
TCO2: 24 mmol/L (ref 22–32)

## 2016-12-20 LAB — LIPASE, BLOOD: LIPASE: 24 U/L (ref 11–51)

## 2016-12-20 MED ORDER — IOPAMIDOL (ISOVUE-300) INJECTION 61%
INTRAVENOUS | Status: AC
  Administered 2016-12-20: 100 mL
  Filled 2016-12-20: qty 100

## 2016-12-20 MED ORDER — SODIUM CHLORIDE 0.9 % IV BOLUS (SEPSIS)
1000.0000 mL | Freq: Once | INTRAVENOUS | Status: AC
  Administered 2016-12-20: 1000 mL via INTRAVENOUS

## 2016-12-20 MED ORDER — ONDANSETRON HCL 4 MG/2ML IJ SOLN
4.0000 mg | Freq: Once | INTRAMUSCULAR | Status: AC
Start: 2016-12-20 — End: 2016-12-20
  Administered 2016-12-20: 4 mg via INTRAVENOUS
  Filled 2016-12-20: qty 2

## 2016-12-20 MED ORDER — ONDANSETRON 4 MG PO TBDP: 4.0000 mg | ORAL_TABLET | Freq: Three times a day (TID) | ORAL | 0 refills | Status: DC | PRN

## 2016-12-20 MED ORDER — MORPHINE SULFATE (PF) 4 MG/ML IV SOLN
2.0000 mg | Freq: Once | INTRAVENOUS | Status: AC
  Administered 2016-12-20: 2 mg via INTRAVENOUS
  Filled 2016-12-20: qty 1

## 2016-12-20 NOTE — Discharge Instructions (Signed)
Use your walker.  Return for inability to eat or drink, fever, sudden worsening pain or repeat passing out.

## 2016-12-20 NOTE — ED Notes (Signed)
Wife explained that pt does not have a daughter and that the wife is [place]. She has a court appointment next week because her husband has Alzheimer's and Parkinsons. Wife states there is a women that will say she is the pt's daughter and will try and persuade the patient.She requests that this lady not be allowed around the patient.

## 2016-12-20 NOTE — ED Notes (Signed)
Patient transported to CT 

## 2016-12-20 NOTE — ED Provider Notes (Signed)
[facility] EMERGENCY DEPARTMENT Provider Note   CSN: [id number] Arrival date & time: 12/20/16  1354     History   Chief Complaint Chief Complaint  Patient presents with  . Near Syncope  . Abdominal Pain    HPI Louis Harris is a 82 y.o. male.  82 yo M with a chief complaint of a syncopal event.  The patient woke up this morning with epigastric abdominal pain.  Describes it as a dull sensation.  Denies radiation.  Nothing seems to make this better or worse.  He denies nausea or vomiting.  The patient had an urge to go to the bathroom and when he went he air down and passed out.  He woke up and had a diarrheal event.  After that called 911.  Denied blood or dark stool.  Denies fevers or chills.  Denies pain like this before.  Denies prior abdominal surgery.   The history is provided by the patient.  Near Syncope  This is a new problem. The current episode started yesterday. The problem occurs constantly. The problem has not changed since onset.Associated symptoms include abdominal pain. Pertinent negatives include no chest pain, no headaches and no shortness of breath. Nothing aggravates the symptoms. Nothing relieves the symptoms. He has tried nothing for the symptoms. The treatment provided no relief.  Abdominal Pain   Associated symptoms include diarrhea. Pertinent negatives include fever, vomiting, headaches, arthralgias and myalgias.    Past Medical History:  Diagnosis Date  . Anxiety   . Arthritis   . Depression   . GERD (gastroesophageal reflux disease)   . Shortness of breath    exertion    Patient Active Problem List   Diagnosis Date Noted  . Anxiety   . Depression   . GERD (gastroesophageal reflux disease)   . Arthritis   . Shortness of breath 01/21/2011  . Pure hypercholesterolemia 01/21/2011    Past Surgical History:  Procedure Laterality Date  . CERVICAL SPINE SURGERY         Home Medications    Prior to Admission medications     Medication Sig Start Date End Date Taking? Authorizing Provider  aspirin EC 81 MG tablet Take 81 mg by mouth daily.     Yes [provider]  calcium citrate-vitamin D (CITRACAL+D) 315-200 MG-UNIT tablet Take 1 tablet by mouth daily.    Yes [provider]  calcium gluconate 500 MG tablet Take 500 mg by mouth daily.    Yes [provider]  donepezil (ARICEPT) 10 MG tablet Take 10 mg by mouth at bedtime.  10/23/15  Yes [provider]  doxazosin (CARDURA) 4 MG tablet Take 4 mg by mouth at bedtime.     Yes [provider]  finasteride (PROSCAR) 5 MG tablet Take 5 mg by mouth daily.   Yes [provider]  fish oil-omega-3 fatty acids 1000 MG capsule Take 1 g by mouth daily.     Yes [provider]  gabapentin (NEURONTIN) 100 MG capsule Take 300 mg by mouth at bedtime.     Yes [provider]  ibuprofen (ADVIL,MOTRIN) 200 MG tablet Take 200-600 mg by mouth every 6 (six) hours as needed for pain.    Yes [provider]  meloxicam (MOBIC) 15 MG tablet Take 15 mg by mouth at bedtime.   Yes [provider]  moxifloxacin (VIGAMOX) 0.5 % ophthalmic solution Place 1 drop into the right eye 4 (four) times daily.   Yes [provider]  Multiple Vitamins-Minerals (ICAPS PO) Take 1 tablet by mouth 2 (two) times daily.   Yes [provider]  Multiple Vitamins-Minerals (MULTIVITAMINS THER. W/MINERALS) TABS Take 1 tablet by mouth daily.     Yes [provider]  Polyethyl Glycol-Propyl Glycol (SYSTANE) 0.4-0.3 % GEL ophthalmic gel Place 1 application 2 (two) times daily as needed into both eyes (dryness).   Yes [provider]  pramipexole (MIRAPEX) 0.125 MG tablet Take 0.25 mg by mouth at bedtime.   Yes [provider]  acetaminophen (TYLENOL) 500 MG tablet Take 2 tablets (1,000 mg total) by mouth every 6 (six) hours as needed. Patient not taking: Reported on 12/20/2016 02/28/16    Arby BarrettePfeiffer, Marcy, MD  albuterol (PROVENTIL HFA;VENTOLIN HFA) 108 (90 BASE) MCG/ACT inhaler Inhale 2 puffs into the lungs every 6 (six) hours as needed for wheezing.    [provider]  ondansetron (ZOFRAN ODT) 4 MG disintegrating tablet Take 1 tablet (4 mg total) every 8 (eight) hours as needed by mouth for nausea or vomiting. 12/20/16   Melene PlanFloyd, Neliah Cuyler, DO  oseltamivir (TAMIFLU) 75 MG capsule Take 1 capsule (75 mg total) by mouth every 12 (twelve) hours. Patient not taking: Reported on 12/20/2016 02/28/16   Arby BarrettePfeiffer, Marcy, MD    Family History Family History  Problem Relation Age of Onset  . Heart attack Father   . Lupus Sister     Social History Social History   Tobacco Use  . Smoking status: Never Smoker  . Smokeless tobacco: Never Used  Substance Use Topics  . Alcohol use: No  . Drug use: No     Allergies   Diovan [valsartan] and Prilosec [omeprazole]   Review of Systems Review of Systems  Constitutional: Negative for chills and fever.  HENT: Negative for congestion and facial swelling.   Eyes: Negative for discharge and visual disturbance.  Respiratory: Negative for shortness of breath.   Cardiovascular: Positive for near-syncope. Negative for chest pain and palpitations.  Gastrointestinal: Positive for abdominal pain and diarrhea. Negative for vomiting.  Musculoskeletal: Negative for arthralgias and myalgias.  Skin: Negative for color change and rash.  Neurological: Negative for tremors, syncope and headaches.  Psychiatric/Behavioral: Negative for confusion and dysphoric mood.     Physical Exam Updated Vital Signs BP 115/68   Pulse 63   Temp 97.8 F (36.6 C) (Oral)   Resp 15   Ht 5\' 8"  (1.727 m)   Wt 86.2 kg (190 lb)   SpO2 100%   BMI 28.89 kg/m   Physical Exam  Constitutional: He is oriented to person, place, and time. He appears well-developed and well-nourished.  HENT:  Head: Normocephalic and atraumatic.  Eyes: EOM are normal. Pupils are equal,  round, and reactive to light.  Neck: Normal range of motion. Neck supple. No JVD present.  Cardiovascular: Normal rate and regular rhythm. Exam reveals no gallop and no friction rub.  No murmur heard. Pulmonary/Chest: No respiratory distress. He has no wheezes.  Abdominal: Normal appearance. He exhibits no distension. There is tenderness in the epigastric area. There is no rebound and no guarding.  Musculoskeletal: Normal range of motion.  Neurological: He is alert and oriented to person, place, and time.  Skin: No rash noted. No pallor.  Psychiatric: He has a normal mood and affect. His behavior is normal.  Nursing note and vitals reviewed.    ED Treatments / Results  Labs (all labs ordered are listed, but only abnormal results are displayed) Labs Reviewed  CBC  WITH DIFFERENTIAL/PLATELET - Abnormal; Notable for the following components:      Result Value   WBC 3.7 (*)    HCT 38.9 (*)    Platelets 104 (*)    Lymphs Abs 0.6 (*)    All other components within normal limits  COMPREHENSIVE METABOLIC PANEL - Abnormal; Notable for the following components:   Glucose, Bld 102 (*)    Total Protein 6.0 (*)    ALT 16 (*)    All other components within normal limits  I-STAT CHEM 8, ED - Abnormal; Notable for the following components:   Glucose, Bld 103 (*)    All other components within normal limits  LIPASE, BLOOD    EKG  EKG Interpretation  Date/Time:  Tuesday December 20 2016 16:03:49 EST Ventricular Rate:  70 PR Interval:    QRS Duration: 137 QT Interval:  456 QTC Calculation: 493 R Axis:   -53 Text Interpretation:  Sinus rhythm RBBB and LAFB No significant change since last tracing no wpw, prolonged qt or brugada Otherwise no significant change Confirmed by Melene PlanFloyd, Tadashi Burkel [phone]([id number]) on 12/20/2016 4:17:19 PM       Radiology Ct Abdomen Pelvis W Contrast  Result Date: 12/20/2016 CLINICAL DATA:  Acute right upper quadrant abdominal pain after fall today. Diarrhea. EXAM: CT ABDOMEN  AND PELVIS WITH CONTRAST TECHNIQUE: Multidetector CT imaging of the abdomen and pelvis was performed using the standard protocol following bolus administration of intravenous contrast. CONTRAST:  100 mL ISOVUE-300 IOPAMIDOL (ISOVUE-300) INJECTION 61% COMPARISON:  None. FINDINGS: Lower chest: Visualized lung bases are unremarkable. Moderate size hiatal hernia is noted. Hepatobiliary: No focal liver abnormality is seen. No gallstones, gallbladder wall thickening, or biliary dilatation. Pancreas: Unremarkable. No pancreatic ductal dilatation or surrounding inflammatory changes. Spleen: Normal in size without focal abnormality. Adrenals/Urinary Tract: Adrenal glands appear normal. Nonobstructive calculus is seen in left kidney. No hydronephrosis or renal obstruction is noted. No ureteral calculi are noted. Urinary bladder is unremarkable. Stomach/Bowel: Stomach is within normal limits. Appendix appears normal. No evidence of bowel wall thickening, distention, or inflammatory changes. Vascular/Lymphatic: Aortic atherosclerosis. No enlarged abdominal or pelvic lymph nodes. Reproductive: Prostate is unremarkable. Other: No abdominal wall hernia or abnormality. No abdominopelvic ascites. Musculoskeletal: No acute or significant osseous findings. IMPRESSION: Moderate size sliding-type hiatal hernia. Nonobstructive left renal calculus. No hydronephrosis or renal obstruction is noted. Aortic atherosclerosis. No other abnormality seen in the abdomen or pelvis. Electronically Signed   By: Lupita RaiderJames  Green Jr, M.D.   On: 12/20/2016 16:16    Procedures Procedures (including critical care time)  Medications Ordered in ED Medications  morphine 4 MG/ML injection 2 mg (2 mg Intravenous Given 12/20/16 1439)  ondansetron (ZOFRAN) injection 4 mg (4 mg Intravenous Given 12/20/16 1439)  sodium chloride 0.9 % bolus 1,000 mL (0 mLs Intravenous Stopped 12/20/16 1535)  iopamidol (ISOVUE-300) 61 % injection (100 mLs  Contrast Given 12/20/16  1550)     Initial Impression / Assessment and Plan / ED Course  I have reviewed the triage vital signs and the nursing notes.  Pertinent labs & imaging results that were available during my care of the patient were reviewed by me and considered in my medical decision making (see chart for details).     82 yo M with a chief complaint of epigastric abdominal pain.  This started this morning.  The patient also had a syncopal event with it.  Syncopal event sounded vasovagal in nature as he had just appeared down to have a bowel movement.  Will obtain labs and a CT scan.  Reassess.  Patient is feeling better.  Taking p.o.  CT scan with no concerning acute finding.  Discussed the results with the patient and family.  We will have them follow-up with her PCP.  4:39 PM:  I have discussed the diagnosis/risks/treatment options with the patient and family and believe the pt to be eligible for discharge home to follow-up with PCP. We also discussed returning to the ED immediately if new or worsening sx occur. We discussed the sx which are most concerning (e.g., sudden worsening pain, fever, inability to tolerate by mouth) that necessitate immediate return. Medications administered to the patient during their visit and any new prescriptions provided to the patient are listed below.  Medications given during this visit Medications  morphine 4 MG/ML injection 2 mg (2 mg Intravenous Given 12/20/16 1439)  ondansetron (ZOFRAN) injection 4 mg (4 mg Intravenous Given 12/20/16 1439)  sodium chloride 0.9 % bolus 1,000 mL (0 mLs Intravenous Stopped 12/20/16 1535)  iopamidol (ISOVUE-300) 61 % injection (100 mLs  Contrast Given 12/20/16 1550)     The patient appears reasonably screen and/or stabilized for discharge and I doubt any other medical condition or other [facility] requiring further screening, evaluation, or treatment in the ED at this time prior to discharge.    Final Clinical Impressions(s) / ED Diagnoses    Final diagnoses:  Epigastric pain  Syncope and collapse    ED Discharge Orders        Ordered    ondansetron (ZOFRAN ODT) 4 MG disintegrating tablet  Every 8 hours PRN     12/20/16 1638       Melene Plan, DO 12/20/16 1639

## 2016-12-20 NOTE — ED Triage Notes (Signed)
[facility] lives at home. [facility] While he was there today eating lunch he had onset of severe dizziness and near syncope. The patient felt the need to go to the bathroom and did have and episode of diarrhea pta and states that he did have intermittent abdominal pain. Iv started pta by [facility]. They state that pt was orthostatic upon their arrival. 400ns given en route. Pt is alert and oriented.

## 2017-11-28 ENCOUNTER — Other Ambulatory Visit: Payer: Self-pay

## 2017-11-28 ENCOUNTER — Ambulatory Visit (HOSPITAL_COMMUNITY)
Admission: EM | Admit: 2017-11-28 | Discharge: 2017-11-28 | Disposition: A | Discharge status: Home / Self Care | Payer: Medicare Other | Attending: Family Medicine | Admitting: Family Medicine

## 2017-11-28 ENCOUNTER — Encounter (HOSPITAL_COMMUNITY): Payer: Self-pay | Admitting: Emergency Medicine

## 2017-11-28 DIAGNOSIS — G2581 Restless legs syndrome: Secondary | ICD-10-CM

## 2017-11-28 NOTE — ED Provider Notes (Signed)
[facility]    CSN: [id number] Arrival date & time: 11/28/17  1032     History   Chief Complaint Chief Complaint  Patient presents with  . RLS    HPI Louis Harris is a 83 y.o. male.   HPI  Patient is here with a caregiver. He does have some memory impairment.  The 2 of them to give me history. He has a history of restless leg syndrome.  He takes Mirapex.  He was started on 1 pill a day, 0.125 mg.  Over time this is increased to 2 a day.  He tells me this is no longer working.  He was up last night several times with restless legs and pain in his calves.  He states he usually does have pain with his restless leg syndrome.  He tells me is from an old [employer] back injury.  He also takes gabapentin 100 mg at night.  He called his primary care doctor and could not be seen today.  He did not want to go to the [facility] because there is such a long wait. He worries that his legs are cramping and restless because of bad circulation.  He would like to have his legs checked. He is otherwise feeling well.  Appetite is good.  Energy level is per normal.  No chest pain, shortness of breath, or pedal edema.  Past Medical History:  Diagnosis Date  . Anxiety   . Arthritis   . Depression   . GERD (gastroesophageal reflux disease)   . Shortness of breath    exertion    Patient Active Problem List   Diagnosis Date Noted  . Anxiety   . Depression   . GERD (gastroesophageal reflux disease)   . Arthritis   . Shortness of breath 01/21/2011  . Pure hypercholesterolemia 01/21/2011    Past Surgical History:  Procedure Laterality Date  . CATARACT EXTRACTION W/PHACO Left 12/12/2012   Procedure: CATARACT EXTRACTION PHACO AND INTRAOCULAR LENS PLACEMENT (IOC);  Surgeon: Shade Flood, MD;  Location: [facility] OR;  Service: Ophthalmology;  Laterality: Left;  . CERVICAL SPINE SURGERY    . LEFT HEART CATHETERIZATION WITH CORONARY ANGIOGRAM N/A 01/21/2011   Procedure: LEFT HEART CATHETERIZATION  WITH CORONARY ANGIOGRAM;  Surgeon: Donato Schultz, MD;  Location[facility] CATH LAB;  Service: Cardiovascular;  Laterality: N/A;       Home Medications    Prior to Admission medications   Medication Sig Start Date End Date Taking? Authorizing Provider  aspirin EC 81 MG tablet Take 81 mg by mouth daily.     Yes [provider]  calcium citrate-vitamin D (CITRACAL+D) 315-200 MG-UNIT tablet Take 1 tablet by mouth daily.    Yes [provider]  calcium gluconate 500 MG tablet Take 500 mg by mouth daily.    Yes [provider]  donepezil (ARICEPT) 10 MG tablet Take 10 mg by mouth at bedtime.  10/23/15  Yes [provider]  doxazosin (CARDURA) 4 MG tablet Take 4 mg by mouth at bedtime.     Yes [provider]  finasteride (PROSCAR) 5 MG tablet Take 5 mg by mouth daily.   Yes [provider]  fish oil-omega-3 fatty acids 1000 MG capsule Take 1 g by mouth daily.     Yes [provider]  gabapentin (NEURONTIN) 100 MG capsule Take 300 mg by mouth at bedtime.     Yes [provider]  meloxicam (MOBIC) 15 MG tablet Take 15 mg by mouth  at bedtime.   Yes [provider]  moxifloxacin (VIGAMOX) 0.5 % ophthalmic solution Place 1 drop into the right eye 4 (four) times daily.   Yes [provider]  Multiple Vitamins-Minerals (ICAPS PO) Take 1 tablet by mouth 2 (two) times daily.   Yes [provider]  Multiple Vitamins-Minerals (MULTIVITAMINS THER. W/MINERALS) TABS Take 1 tablet by mouth daily.     Yes [provider]  Polyethyl Glycol-Propyl Glycol (SYSTANE) 0.4-0.3 % GEL ophthalmic gel Place 1 application 2 (two) times daily as needed into both eyes (dryness).   Yes [provider]  pramipexole (MIRAPEX) 0.125 MG tablet Take 0.25 mg by mouth at bedtime.   Yes [provider]  acetaminophen (TYLENOL) 500 MG tablet Take 2 tablets (1,000 mg total) by mouth every 6 (six) hours as needed. Patient  not taking: Reported on 12/20/2016 02/28/16   Arby Barrette, MD  albuterol (PROVENTIL HFA;VENTOLIN HFA) 108 (90 BASE) MCG/ACT inhaler Inhale 2 puffs into the lungs every 6 (six) hours as needed for wheezing.    [provider]  ibuprofen (ADVIL,MOTRIN) 200 MG tablet Take 200-600 mg by mouth every 6 (six) hours as needed for pain.     [provider]  ondansetron (ZOFRAN ODT) 4 MG disintegrating tablet Take 1 tablet (4 mg total) every 8 (eight) hours as needed by mouth for nausea or vomiting. 12/20/16   Melene Plan, DO    Family History Family History  Problem Relation Age of Onset  . Heart attack Father   . Lupus Sister     Social History Social History   Tobacco Use  . Smoking status: Never Smoker  . Smokeless tobacco: Never Used  Substance Use Topics  . Alcohol use: No  . Drug use: No     Allergies   Diovan [valsartan] and Prilosec [omeprazole]   Review of Systems Review of Systems  Constitutional: Negative for chills and fever.  HENT: Negative for ear pain and sore throat.   Eyes: Negative for pain and visual disturbance.  Respiratory: Negative for cough and shortness of breath.   Cardiovascular: Negative for chest pain and palpitations.  Gastrointestinal: Negative for abdominal pain and vomiting.  Genitourinary: Negative for dysuria and hematuria.  Musculoskeletal: Positive for gait problem and myalgias. Negative for arthralgias and back pain.       States balance is poor  Skin: Negative for color change and rash.  Neurological: Negative for seizures and syncope.  Psychiatric/Behavioral: Positive for sleep disturbance. Negative for dysphoric mood.  All other systems reviewed and are negative.    Physical Exam Triage Vital Signs ED Triage Vitals [11/28/17 1140]  Enc Vitals Group     BP 134/72     Pulse Rate (!) 49     Resp      Temp 98.1 F (36.7 C)     Temp Source Oral     SpO2 99 %     Weight      Height      Head Circumference       Peak Flow      Pain Score 9     Pain Loc      Pain Edu?      Excl. in GC?    No data found.  Updated Vital Signs BP 134/72 (BP Location: Left Arm)   Pulse (!) 49   Temp 98.1 F (36.7 C) (Oral)   SpO2 99%      Physical Exam  Constitutional: He appears well-developed and well-nourished.  No distress.  HENT:  Head: Normocephalic and atraumatic.  Mouth/Throat: Oropharynx is clear and moist.  Eyes: Pupils are equal, round, and reactive to light. Conjunctivae are normal.  Neck: Normal range of motion. No JVD present.  Cardiovascular: Normal rate, regular rhythm, normal heart sounds and intact distal pulses.  Pedal pulses are 1+ bilaterally the feet, dorsalis pedis.  Good cap refill  Pulmonary/Chest: Effort normal and breath sounds normal. No respiratory distress. He has no rales.  Abdominal: Soft. He exhibits no distension.  Musculoskeletal: Normal range of motion. He exhibits no edema.  No skin changes of lower extremities.  Normal calves.  Flat feet.  Lymphadenopathy:    He has no cervical adenopathy.  Neurological: He is alert.  Skin: Skin is warm and dry.     UC Treatments / Results  Labs (all labs ordered are listed, but only abnormal results are displayed) Labs Reviewed - No data to display  EKG None  Radiology No results found.  Procedures Procedures (including critical care time)  Medications Ordered in UC Medications - No data to display  Initial Impression / Assessment and Plan / UC Course  I have reviewed the triage vital signs and the nursing notes.  Pertinent labs & imaging results that were available during my care of the patient were reviewed by me and considered in my medical decision making (see chart for details).     I discussed restless leg syndrome.  the maximum daily dose of the Mirapex is 0.5 mg.  He can further increase his dose from 2 pills at night to 3 pills at night.  I told him there is also room to increase the gabapentin if he needs  it.  For now, increase the Mirapex and call his primary care doctor to schedule a follow-up. Final Clinical Impressions(s) / UC Diagnoses   Final diagnoses:  Restless leg syndrome     Discharge Instructions     Take an additional mirapex at night- currently on 2 tabs, take 3 Make sure you drink plenty of fluids Follow up with your primary care doctor.  Call them if early refill is needed    ED Prescriptions    None     Controlled Substance Prescriptions [place] Controlled Substance Registry consulted? Not Applicable   Eustace Moore, MD 11/28/17 1329

## 2017-11-28 NOTE — ED Triage Notes (Signed)
Pt reports a history of RLS, but he states it is getting worse and the pain was keeping him awake last night.

## 2017-11-28 NOTE — Discharge Instructions (Signed)
Take an additional mirapex at night- currently on 2 tabs, take 3 Make sure you drink plenty of fluids Follow up with your primary care doctor.  Call them if early refill is needed

## 2018-04-12 ENCOUNTER — Emergency Department (HOSPITAL_COMMUNITY): Payer: Medicare Other

## 2018-04-12 ENCOUNTER — Encounter (HOSPITAL_COMMUNITY): Payer: Self-pay | Admitting: Emergency Medicine

## 2018-04-12 ENCOUNTER — Emergency Department (HOSPITAL_COMMUNITY)
Admission: EM | Admit: 2018-04-12 | Discharge: 2018-04-13 | Disposition: A | Discharge status: Home / Self Care | Payer: Medicare Other | Attending: Emergency Medicine | Admitting: Emergency Medicine

## 2018-04-12 DIAGNOSIS — Z888 Allergy status to other drugs, medicaments and biological substances status: Secondary | ICD-10-CM | POA: Insufficient documentation

## 2018-04-12 DIAGNOSIS — Z8249 Family history of ischemic heart disease and other diseases of the circulatory system: Secondary | ICD-10-CM | POA: Diagnosis not present

## 2018-04-12 DIAGNOSIS — R531 Weakness: Secondary | ICD-10-CM

## 2018-04-12 DIAGNOSIS — R262 Difficulty in walking, not elsewhere classified: Secondary | ICD-10-CM | POA: Diagnosis not present

## 2018-04-12 DIAGNOSIS — R0602 Shortness of breath: Secondary | ICD-10-CM | POA: Insufficient documentation

## 2018-04-12 DIAGNOSIS — Z79899 Other long term (current) drug therapy: Secondary | ICD-10-CM | POA: Insufficient documentation

## 2018-04-12 DIAGNOSIS — R4182 Altered mental status, unspecified: Secondary | ICD-10-CM | POA: Diagnosis present

## 2018-04-12 DIAGNOSIS — R410 Disorientation, unspecified: Secondary | ICD-10-CM | POA: Diagnosis not present

## 2018-04-12 DIAGNOSIS — M6281 Muscle weakness (generalized): Secondary | ICD-10-CM | POA: Diagnosis not present

## 2018-04-12 DIAGNOSIS — M2681 Anterior soft tissue impingement: Secondary | ICD-10-CM | POA: Diagnosis not present

## 2018-04-12 LAB — CBG MONITORING, ED: Glucose-Capillary: 85 mg/dL (ref 70–99)

## 2018-04-12 LAB — DIFFERENTIAL
ABS IMMATURE GRANULOCYTES: 0.01 10*3/uL (ref 0.00–0.07)
BASOS PCT: 0 %
Basophils Absolute: 0 10*3/uL (ref 0.0–0.1)
Eosinophils Absolute: 0 10*3/uL (ref 0.0–0.5)
Eosinophils Relative: 0 %
Immature Granulocytes: 0 %
LYMPHS ABS: 0.5 10*3/uL — AB (ref 0.7–4.0)
Lymphocytes Relative: 12 %
MONOS PCT: 9 %
Monocytes Absolute: 0.4 10*3/uL (ref 0.1–1.0)
NEUTROS ABS: 3.3 10*3/uL (ref 1.7–7.7)
Neutrophils Relative %: 79 %

## 2018-04-12 LAB — COMPREHENSIVE METABOLIC PANEL
ALK PHOS: 52 U/L (ref 38–126)
ALT: 32 U/L (ref 0–44)
ANION GAP: 11 (ref 5–15)
AST: 81 U/L — AB (ref 15–41)
Albumin: 4 g/dL (ref 3.5–5.0)
BILIRUBIN TOTAL: 1.2 mg/dL (ref 0.3–1.2)
BUN: 18 mg/dL (ref 8–23)
CALCIUM: 9.4 mg/dL (ref 8.9–10.3)
CO2: 23 mmol/L (ref 22–32)
Chloride: 108 mmol/L (ref 98–111)
Creatinine, Ser: 0.95 mg/dL (ref 0.61–1.24)
GFR calc Af Amer: >60 mL/min (ref >60)
GFR calc non Af Amer: >60 mL/min (ref >60)
GLUCOSE: 98 mg/dL (ref 70–99)
Potassium: 3.9 mmol/L (ref 3.5–5.1)
Sodium: 142 mmol/L (ref 135–145)
TOTAL PROTEIN: 6.6 g/dL (ref 6.5–8.1)

## 2018-04-12 LAB — CBC
HCT: 41.7 % (ref 39.0–52.0)
HEMOGLOBIN: 13.3 g/dL (ref 13.0–17.0)
MCH: 30.4 pg (ref 26.0–34.0)
MCHC: 31.9 g/dL (ref 30.0–36.0)
MCV: 95.4 fL (ref 80.0–100.0)
Platelets: UNDETERMINED 10*3/uL (ref 150–400)
RBC: 4.37 MIL/uL (ref 4.22–5.81)
RDW: 14.6 % (ref 11.5–15.5)
WBC: 4.3 10*3/uL (ref 4.0–10.5)
nRBC: 0 % (ref 0.0–0.2)

## 2018-04-12 LAB — PROTIME-INR
INR: 1.1 (ref 0.8–1.2)
Prothrombin Time: 14.2 seconds (ref 11.4–15.2)

## 2018-04-12 LAB — I-STAT CREATININE, ED: Creatinine, Ser: 0.9 mg/dL (ref 0.61–1.24)

## 2018-04-12 LAB — APTT: aPTT: 36 seconds (ref 24–36)

## 2018-04-12 MED ORDER — SODIUM CHLORIDE 0.9% FLUSH: 3.0000 mL | Freq: Once | INTRAVENOUS | Status: DC

## 2018-04-12 NOTE — ED Notes (Signed)
Patient transported to MRI 

## 2018-04-12 NOTE — ED Triage Notes (Signed)
Pt's caregiver states that when she arrived to check on pt yesterday around 2pm she noticed he was confused, dressed with his pants backwards, walking leaning to the side. Pt speech clear, grips weak but equal. This has continued into today.
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
## 2018-04-13 ENCOUNTER — Encounter (HOSPITAL_COMMUNITY): Payer: Self-pay | Admitting: *Deleted

## 2018-04-13 ENCOUNTER — Telehealth: Payer: Self-pay | Admitting: *Deleted

## 2018-04-13 LAB — URINALYSIS, ROUTINE W REFLEX MICROSCOPIC
BILIRUBIN URINE: NEGATIVE
GLUCOSE, UA: NEGATIVE mg/dL
HGB URINE DIPSTICK: NEGATIVE
Ketones, ur: 20 mg/dL — AB
LEUKOCYTE UA: NEGATIVE
NITRITE: NEGATIVE
PROTEIN: 100 mg/dL — AB
Specific Gravity, Urine: 1.026 (ref 1.005–1.030)
pH: 5 (ref 5.0–8.0)

## 2018-04-13 NOTE — ED Notes (Signed)
Pt is AMS, POA is aware of discharge and agrees to discharge plan but not present to sign on discharge.

## 2018-04-13 NOTE — Discharge Instructions (Signed)
REturn here as needed. Follow up with your doctor. °

## 2018-04-13 NOTE — Telephone Encounter (Signed)
Received call from POA Althea wanting to know if pt was on his way home. States caregiver is at the home waiting. Spoke to pt's ED RN and he was sent home via [facility]. Isidoro Donning RN CCM Case Mgmt phone [phone]

## 2018-04-13 NOTE — ED Notes (Signed)
CM at the bedside

## 2018-04-13 NOTE — NC FL2 (Signed)
[place] MEDICAID FL2 LEVEL OF CARE SCREENING TOOL     IDENTIFICATION  Patient Name: Louis Harris Birthdate: 20-May-1934 Sex: male Admission Date (Current Location): 04/12/2018  [facility] and [place] Number:  Producer, television/film/video and Address:  [facility], [address], [place], [place] [address]      Provider Number: [id number]  Attending Physician Name and Address:  Gilda Crease, *  Relative Name and Phone Number:       Current Level of Care: ICF Recommended Level of Care: Skilled Nursing Facility Prior Approval Number:    Date Approved/Denied:   PASRR Number:    Discharge Plan: SNF    Current Diagnoses: Patient Active Problem List   Diagnosis Date Noted  . Anxiety   . Depression   . GERD (gastroesophageal reflux disease)   . Arthritis   . Shortness of breath 01/21/2011  . Pure hypercholesterolemia 01/21/2011    Orientation RESPIRATION BLADDER Height & Weight     Time, Self, Situation, Place  Normal Continent Weight:   Height:     BEHAVIORAL SYMPTOMS/MOOD NEUROLOGICAL BOWEL NUTRITION STATUS  Other (Comment)(slow to respond at times)   Continent Diet  AMBULATORY STATUS COMMUNICATION OF NEEDS Skin   Extensive Assist Verbally Normal                       Personal Care Assistance Level of Assistance  Bathing, Feeding, Dressing Bathing Assistance: Maximum assistance Feeding assistance: Limited assistance Dressing Assistance: Maximum assistance     Functional Limitations Info  Sight, Hearing, Speech Sight Info: Impaired(wears glasses) Hearing Info: Adequate Speech Info: Adequate    SPECIAL CARE FACTORS FREQUENCY  PT (By licensed PT), OT (By licensed OT)     PT Frequency: 5x a week OT Frequency: 5x a week             Contractures Contractures Info: Not present    Additional Factors Info  Code Status, Allergies Code Status Info: Full Code Allergies Info: Diovan and Prilosec           Current  Medications (04/13/2018):  This is the current hospital active medication list Current Facility-Administered Medications  Medication Dose Route Frequency Provider Last Rate Last Dose  . sodium chloride flush (NS) 0.9 % injection 3 mL  3 mL Intravenous Once Wynetta Fines, MD       Current Outpatient Medications  Medication Sig Dispense Refill  . doxazosin (CARDURA) 4 MG tablet Take 4 mg by mouth daily.     . finasteride (PROSCAR) 5 MG tablet Take 5 mg by mouth daily.    Marland Kitchen gabapentin (NEURONTIN) 100 MG capsule Take 300 mg by mouth at bedtime.     . meloxicam (MOBIC) 15 MG tablet Take 15 mg by mouth at bedtime.    Bertram Gala Glycol-Propyl Glycol (SYSTANE) 0.4-0.3 % GEL ophthalmic gel Place 1 application 2 (two) times daily as needed into both eyes (dryness).    . pramipexole (MIRAPEX) 0.125 MG tablet Take 0.25 mg by mouth at bedtime.     . rivastigmine (EXELON) 3 MG capsule Take 3 mg by mouth See admin instructions. Start date 02/28/18 3 mg for 2 weeks daily 3 mg BID for 2 weeks 3 mg daily and 6 mg in the evening at supper for 2 weeks 6 mg twice a day    . acetaminophen (TYLENOL) 500 MG tablet Take 2 tablets (1,000 mg total) by mouth every 6 (six) hours as needed. 30 tablet  0  . albuterol (PROVENTIL HFA;VENTOLIN HFA) 108 (90 BASE) MCG/ACT inhaler Inhale 2 puffs into the lungs every 6 (six) hours as needed for wheezing.    Marland Kitchen aspirin EC 81 MG tablet Take 81 mg by mouth daily.      . calcium citrate-vitamin D (CITRACAL+D) 315-200 MG-UNIT tablet Take 1 tablet by mouth daily.     . calcium gluconate 500 MG tablet Take 500 mg by mouth daily.     Marland Kitchen donepezil (ARICEPT) 10 MG tablet Take 10 mg by mouth at bedtime.     . fish oil-omega-3 fatty acids 1000 MG capsule Take 1 g by mouth daily.      Marland Kitchen ibuprofen (ADVIL,MOTRIN) 200 MG tablet Take 200-600 mg by mouth every 6 (six) hours as needed for pain.     Marland Kitchen moxifloxacin (VIGAMOX) 0.5 % ophthalmic solution Place 1 drop into the right eye 4 (four) times  daily.     . Multiple Vitamins-Minerals (ICAPS PO) Take 1 tablet by mouth 2 (two) times daily.    . Multiple Vitamins-Minerals (MULTIVITAMINS THER. W/MINERALS) TABS Take 1 tablet by mouth daily.      . ondansetron (ZOFRAN ODT) 4 MG disintegrating tablet Take 1 tablet (4 mg total) every 8 (eight) hours as needed by mouth for nausea or vomiting. (Patient not taking: Reported on 04/13/2018) 20 tablet 0     Discharge Medications: Please see discharge summary for a list of discharge medications.  Relevant Imaging Results:  Relevant Lab Results:   Additional Information SSN: [id number] Ht: 5'8" wt: 86.2kg  Oletta Cohn, RN

## 2018-04-13 NOTE — ED Notes (Signed)
Messaged PT in regards to consult in order to coordinate care and plan for discharge.

## 2018-04-13 NOTE — ED Provider Notes (Signed)
"  Louis Harris is a 84 y.o. male anxiety, arthritis, depression, GERD who presents for evaluation of altered mental status.  Most of the history is provided by family friend who is at bedside.  Patient lives in an assisted independent living facility.  She states that when she went to go check on him today, he seemed disoriented and confused.  She reports that he had put his pants on backwards and had only one shoe on.  She states that he had difficulty eating and was confused on how to eat which she states is abnormal for him.  She reports that normally he knows where he is at and is able to communicate without any difficulties.  Patient had not been complaining of any recent sicknesses.  Family member does not think patient has had any diagnosis of dementia.  She also notes that when he was walking, he seemed to be leaning to one side not using his right side.  Patient states he has some lower back pain but otherwise denies any chest pain, difficulty breathing, abdominal pain.  The history is provided by the patient and a friend."   Patient's caregiver reports that she checks on the patient every couple days.  He does not have a nurse checking on him daily at his independent living facility. He has previously been responsible for activities of daily living including toileting and dressing himself.  She expresses concern about the patient living independently because sometimes he will go for days and not answer his phone or show up to weekly events such as Bible study.  He ambulates independently, but she expresses concerns about his gait gradually changing and appearing more unsteady on his feet over the last few months.  Physical Exam  BP (!) 140/104 (BP Location: Left Arm)   Pulse 71   Temp 98.9 F (37.2 C) (Oral)   Resp 14   SpO2 100%   Physical Exam  Pleasant elderly gentleman.  Drowsy and slow to speak.  Answers questions appropriately.  ED Course/Procedures     Procedures  MDM    84 year old male received a signout Graciella Freer, [place], pending MRI and MRA, which are unremarkable.  UA does not appear overly infectious, but urine culture sent. Please see her note for further work-up and medical decision making.  On my exam, the patient is somewhat drowsy, but answers questions appropriately.  Caregiver expresses concerns over his ability to care for himself since he lives in independent living.  The patient was ambulated independently, and nursing staff reports the patient was unsteady on his feet.  He was much more steady when ambulating with a rolling walker.  I think it is reasonable that he has a PT/OT assessment, which may also require speaking to case management because if he is cleared he may need a rolling walker prior to discharge versus placement depending on PT/OT assessment.  On reexamination, the patient is much more alert and not drowsy.  He makes good eye contact and is engaged in the conversation.  Continues to answer questions appropriately.  Patient care transferred to [facility], PA-C at the end of my shift ending PT OT assessment and case management consult. Patient presentation, ED course, and plan of care discussed with review of all pertinent labs and imaging. Please see his/her note for further details regarding further ED course and disposition.        Barkley Boards, PA-C 04/13/18 2202    Gilda Crease, MD 04/16/18 [phone]

## 2018-04-13 NOTE — Discharge Planning (Signed)
Pt POA has private home care services in place.  No further EDCM needs identified.

## 2018-04-13 NOTE — Evaluation (Signed)
Physical Therapy Evaluation Patient Details Name: Louis Harris MRN: [id number] DOB: Dec 18, 1934 Today's Date: 04/13/2018   History of Present Illness  Pt is an 84 y/o male presenting to the ED with AMS. MRI negative for acute infarct, however, did show old infarct vs TBI. PMH includes arthritis, CVA, and depression.  Clinical Impression  Pt presenting with problem above and deficits below. Pt requiring mod to max A to perform bed mobility tasks. Pt with R lateral lean in sitting requiring at least mod A to maintain sitting balance. Unsafe to attempt further mobility with +1 assist given current deficits. Pt from ILF per notes and does not have necessary assist at home. Will continue to follow acutely to maximize functional mobility independence and safety.      Follow Up Recommendations SNF;Supervision/Assistance - 24 hour    Equipment Recommendations  None recommended by PT    Recommendations for Other Services       Precautions / Restrictions Precautions Precautions: Fall Restrictions Weight Bearing Restrictions: No      Mobility  Bed Mobility Overal bed mobility: Needs Assistance Bed Mobility: Supine to Sit;Sit to Supine     Supine to sit: Max assist Sit to supine: Mod assist   General bed mobility comments: Max A for LE assist and trunk elevation. Once sitting at EOB, pt with R lateral lean and unable to maintain sitting balance without external support. Further mobility deferred as unsafe to attempt standing from higher stretcher height. Mod A for LE assist and trunk assist to return to supine.   Transfers                    Ambulation/Gait                Stairs            Wheelchair Mobility    Modified Rankin (Stroke Patients Only)       Balance Overall balance assessment: Needs assistance Sitting-balance support: Feet unsupported;Bilateral upper extremity supported Sitting balance-Leahy Scale: Poor Sitting balance - Comments: R lateral  lean in sitting requiring at least mod A to maintain sitting balance.                                      Pertinent Vitals/Pain Pain Assessment: No/denies pain    Home Living Family/patient expects to be discharged to:: Private residence Living Arrangements: Alone Available Help at Discharge: Family;Available PRN/intermittently Type of Home: Independent living facility Home Access: Stairs to enter   Entrance Stairs-Number of Steps: Unable to clarify number of steps  Home Layout: One level Home Equipment: None      Prior Function Level of Independence: Independent               Hand Dominance        Extremity/Trunk Assessment   Upper Extremity Assessment Upper Extremity Assessment: Generalized weakness    Lower Extremity Assessment Lower Extremity Assessment: RLE deficits/detail;LLE deficits/detail RLE Deficits / Details: Required assist with performing heel slide. Able to perform partial ankle pump.  LLE Deficits / Details: Required assist with performing heel slide. Able to perform partial ankle pump.     Cervical / Trunk Assessment Cervical / Trunk Assessment: Other exceptions Cervical / Trunk Exceptions: R lateral lean in sitting.   Communication   Communication: No difficulties  Cognition Arousal/Alertness: Lethargic Behavior During Therapy: Flat affect Overall Cognitive Status: No family/caregiver present to  determine baseline cognitive functioning                                 General Comments: Pt alert and oriented to person. Knew it was February, however, could not recall the year. Did not know where he was or why he was here. Pt very slow to respond to questions.       General Comments General comments (skin integrity, edema, etc.): No family present during session.     Exercises     Assessment/Plan    PT Assessment Patient needs continued PT services  PT Problem List Decreased strength;Decreased  balance;Decreased activity tolerance;Decreased mobility;Decreased cognition;Decreased knowledge of use of DME;Decreased safety awareness;Decreased knowledge of precautions       PT Treatment Interventions DME instruction;Gait training;Functional mobility training;Therapeutic activities;Therapeutic exercise;Balance training;Patient/family education;Cognitive remediation    PT Goals (Current goals can be found in the Care Plan section)  Acute Rehab PT Goals PT Goal Formulation: Patient unable to participate in goal setting Time For Goal Achievement: 04/27/18 Potential to Achieve Goals: Fair    Frequency Min 2X/week   Barriers to discharge Decreased caregiver support      Co-evaluation               AM-PAC PT "6 Clicks" Mobility  Outcome Measure Help needed turning from your back to your side while in a flat bed without using bedrails?: A Lot Help needed moving from lying on your back to sitting on the side of a flat bed without using bedrails?: A Lot Help needed moving to and from a bed to a chair (including a wheelchair)?: A Lot Help needed standing up from a chair using your arms (e.g., wheelchair or bedside chair)?: A Lot Help needed to walk in hospital room?: Total Help needed climbing 3-5 steps with a railing? : Total 6 Click Score: 10    End of Session Equipment Utilized During Treatment: Gait belt Activity Tolerance: Patient tolerated treatment well Patient left: in bed;with call bell/phone within reach Nurse Communication: Mobility status PT Visit Diagnosis: Muscle weakness (generalized) (M62.81);Unsteadiness on feet (R26.81);Difficulty in walking, not elsewhere classified (R26.2)    Time: 1010-1021 PT Time Calculation (min) (ACUTE ONLY): 11 min   Charges:   PT Evaluation $PT Eval Moderate Complexity: 1 Mod          Gladys Damme, PT, DPT  Acute Rehabilitation Services  Pager: [phone] Office: [phone]   Lehman Prom 04/13/2018, 10:34 AM

## 2018-04-13 NOTE — Discharge Planning (Signed)
[facility] consulted regarding disposition needs. EDCM will follow up after PT evaluation to help determine DME and/or placement needs.

## 2018-04-13 NOTE — Discharge Planning (Signed)
EDCM spoke with Maudry Mayhew, POA.  EDCM updated POA of disposition plans of SNF.  POA states she would rather he return home with personal hired in-home care.  EDCM relayed information to EDP Lawyer.

## 2018-04-14 LAB — URINE CULTURE: Culture: NO GROWTH

## 2018-04-30 ENCOUNTER — Emergency Department (HOSPITAL_COMMUNITY): Payer: Medicare Other

## 2018-04-30 ENCOUNTER — Encounter (HOSPITAL_COMMUNITY): Payer: Self-pay | Admitting: *Deleted

## 2018-04-30 ENCOUNTER — Other Ambulatory Visit: Payer: Self-pay

## 2018-04-30 ENCOUNTER — Inpatient Hospital Stay (HOSPITAL_COMMUNITY)
Admission: EM | Admit: 2018-04-30 | Discharge: 2018-05-04 | DRG: 177 | Disposition: A | Discharge status: Skilled Nursing Facility | Payer: Medicare Other | Attending: Internal Medicine | Admitting: Internal Medicine

## 2018-04-30 DIAGNOSIS — R4182 Altered mental status, unspecified: Secondary | ICD-10-CM | POA: Diagnosis not present

## 2018-04-30 DIAGNOSIS — R339 Retention of urine, unspecified: Secondary | ICD-10-CM | POA: Diagnosis present

## 2018-04-30 DIAGNOSIS — Z79899 Other long term (current) drug therapy: Secondary | ICD-10-CM

## 2018-04-30 DIAGNOSIS — Z888 Allergy status to other drugs, medicaments and biological substances status: Secondary | ICD-10-CM

## 2018-04-30 DIAGNOSIS — G20 Parkinson's disease: Secondary | ICD-10-CM | POA: Diagnosis present

## 2018-04-30 DIAGNOSIS — Z8249 Family history of ischemic heart disease and other diseases of the circulatory system: Secondary | ICD-10-CM

## 2018-04-30 DIAGNOSIS — Z961 Presence of intraocular lens: Secondary | ICD-10-CM | POA: Diagnosis present

## 2018-04-30 DIAGNOSIS — F329 Major depressive disorder, single episode, unspecified: Secondary | ICD-10-CM | POA: Diagnosis present

## 2018-04-30 DIAGNOSIS — E7800 Pure hypercholesterolemia, unspecified: Secondary | ICD-10-CM | POA: Diagnosis present

## 2018-04-30 DIAGNOSIS — R5381 Other malaise: Secondary | ICD-10-CM | POA: Diagnosis present

## 2018-04-30 DIAGNOSIS — J690 Pneumonitis due to inhalation of food and vomit: Principal | ICD-10-CM | POA: Diagnosis present

## 2018-04-30 DIAGNOSIS — J189 Pneumonia, unspecified organism: Secondary | ICD-10-CM | POA: Diagnosis not present

## 2018-04-30 DIAGNOSIS — Z9842 Cataract extraction status, left eye: Secondary | ICD-10-CM

## 2018-04-30 DIAGNOSIS — F419 Anxiety disorder, unspecified: Secondary | ICD-10-CM | POA: Diagnosis present

## 2018-04-30 DIAGNOSIS — K219 Gastro-esophageal reflux disease without esophagitis: Secondary | ICD-10-CM | POA: Diagnosis present

## 2018-04-30 DIAGNOSIS — D696 Thrombocytopenia, unspecified: Secondary | ICD-10-CM | POA: Diagnosis present

## 2018-04-30 DIAGNOSIS — M1990 Unspecified osteoarthritis, unspecified site: Secondary | ICD-10-CM | POA: Diagnosis present

## 2018-04-30 DIAGNOSIS — Z832 Family history of diseases of the blood and blood-forming organs and certain disorders involving the immune mechanism: Secondary | ICD-10-CM

## 2018-04-30 DIAGNOSIS — J181 Lobar pneumonia, unspecified organism: Secondary | ICD-10-CM

## 2018-04-30 DIAGNOSIS — G9341 Metabolic encephalopathy: Secondary | ICD-10-CM | POA: Diagnosis present

## 2018-04-30 DIAGNOSIS — F0280 Dementia in other diseases classified elsewhere without behavioral disturbance: Secondary | ICD-10-CM | POA: Diagnosis present

## 2018-04-30 DIAGNOSIS — Z7982 Long term (current) use of aspirin: Secondary | ICD-10-CM

## 2018-04-30 DIAGNOSIS — Z791 Long term (current) use of non-steroidal anti-inflammatories (NSAID): Secondary | ICD-10-CM

## 2018-04-30 HISTORY — DX: Parkinson's disease: G20

## 2018-04-30 HISTORY — DX: Parkinson's disease without dyskinesia, without mention of fluctuations: G20.A1

## 2018-04-30 LAB — CBC WITH DIFFERENTIAL/PLATELET
Abs Immature Granulocytes: 0.01 10*3/uL (ref 0.00–0.07)
Basophils Absolute: 0 10*3/uL (ref 0.0–0.1)
Basophils Relative: 0 %
Eosinophils Absolute: 0 10*3/uL (ref 0.0–0.5)
Eosinophils Relative: 0 %
HCT: 43.1 % (ref 39.0–52.0)
HEMOGLOBIN: 13.9 g/dL (ref 13.0–17.0)
Immature Granulocytes: 0 %
Lymphocytes Relative: 8 %
Lymphs Abs: 0.4 10*3/uL — ABNORMAL LOW (ref 0.7–4.0)
MCH: 30.8 pg (ref 26.0–34.0)
MCHC: 32.3 g/dL (ref 30.0–36.0)
MCV: 95.6 fL (ref 80.0–100.0)
MONOS PCT: 7 %
Monocytes Absolute: 0.4 10*3/uL (ref 0.1–1.0)
Neutro Abs: 4.4 10*3/uL (ref 1.7–7.7)
Neutrophils Relative %: 85 %
Platelets: 136 10*3/uL — ABNORMAL LOW (ref 150–400)
RBC: 4.51 MIL/uL (ref 4.22–5.81)
RDW: 14.1 % (ref 11.5–15.5)
WBC: 5.2 10*3/uL (ref 4.0–10.5)
nRBC: 0 % (ref 0.0–0.2)

## 2018-04-30 LAB — LACTIC ACID, PLASMA: LACTIC ACID, VENOUS: 1.3 mmol/L (ref 0.5–1.9)

## 2018-04-30 LAB — COMPREHENSIVE METABOLIC PANEL
ALT: 17 U/L (ref 0–44)
AST: 19 U/L (ref 15–41)
Albumin: 4.1 g/dL (ref 3.5–5.0)
Alkaline Phosphatase: 60 U/L (ref 38–126)
Anion gap: 10 (ref 5–15)
BUN: 11 mg/dL (ref 8–23)
CO2: 23 mmol/L (ref 22–32)
Calcium: 9.4 mg/dL (ref 8.9–10.3)
Chloride: 107 mmol/L (ref 98–111)
Creatinine, Ser: 0.84 mg/dL (ref 0.61–1.24)
GFR calc Af Amer: >60 mL/min (ref >60)
GFR calc non Af Amer: >60 mL/min (ref >60)
Glucose, Bld: 99 mg/dL (ref 70–99)
POTASSIUM: 3.9 mmol/L (ref 3.5–5.1)
Sodium: 140 mmol/L (ref 135–145)
Total Bilirubin: 0.9 mg/dL (ref 0.3–1.2)
Total Protein: 7 g/dL (ref 6.5–8.1)

## 2018-04-30 LAB — URINALYSIS, ROUTINE W REFLEX MICROSCOPIC
Bilirubin Urine: NEGATIVE
Glucose, UA: NEGATIVE mg/dL
Hgb urine dipstick: NEGATIVE
Ketones, ur: 40 mg/dL — AB
Leukocytes,Ua: NEGATIVE
Nitrite: NEGATIVE
Protein, ur: NEGATIVE mg/dL
Specific Gravity, Urine: 1.025 (ref 1.005–1.030)
pH: 6 (ref 5.0–8.0)

## 2018-04-30 LAB — CBG MONITORING, ED
Glucose-Capillary: 101 mg/dL — ABNORMAL HIGH (ref 70–99)
Glucose-Capillary: 98 mg/dL (ref 70–99)

## 2018-04-30 LAB — STREP PNEUMONIAE URINARY ANTIGEN: STREP PNEUMO URINARY ANTIGEN: NEGATIVE

## 2018-04-30 MED ORDER — SODIUM CHLORIDE 0.9% FLUSH: 3.0000 mL | INTRAVENOUS | Status: DC | PRN

## 2018-04-30 MED ORDER — ALBUTEROL SULFATE (2.5 MG/3ML) 0.083% IN NEBU: 3.0000 mL | INHALATION_SOLUTION | Freq: Four times a day (QID) | RESPIRATORY_TRACT | Status: DC | PRN

## 2018-04-30 MED ORDER — SODIUM CHLORIDE 0.9 % IV BOLUS
1000.0000 mL | Freq: Once | INTRAVENOUS | Status: AC
  Administered 2018-04-30: 1000 mL via INTRAVENOUS

## 2018-04-30 MED ORDER — GADOBUTROL 1 MMOL/ML IV SOLN
8.0000 mL | Freq: Once | INTRAVENOUS | Status: AC | PRN
  Administered 2018-04-30: 8 mL via INTRAVENOUS

## 2018-04-30 MED ORDER — SODIUM CHLORIDE 0.9% FLUSH
3.0000 mL | Freq: Two times a day (BID) | INTRAVENOUS | Status: DC
  Administered 2018-04-30 – 2018-05-04 (×8): 3 mL via INTRAVENOUS

## 2018-04-30 MED ORDER — SODIUM CHLORIDE 0.9 % IV SOLN: 1.0000 g | INTRAVENOUS | Status: DC

## 2018-04-30 MED ORDER — SODIUM CHLORIDE 0.9 % IV SOLN
500.0000 mg | INTRAVENOUS | Status: DC
  Administered 2018-05-01 – 2018-05-02 (×2): 500 mg via INTRAVENOUS
  Filled 2018-04-30 (×3): qty 500

## 2018-04-30 MED ORDER — SODIUM CHLORIDE 0.9 % IV SOLN
500.0000 mg | Freq: Once | INTRAVENOUS | Status: AC
  Administered 2018-04-30: 500 mg via INTRAVENOUS
  Filled 2018-04-30: qty 500

## 2018-04-30 MED ORDER — SODIUM CHLORIDE 0.9 % IV SOLN
1.0000 g | INTRAVENOUS | Status: DC
  Administered 2018-05-01 – 2018-05-03 (×3): 1 g via INTRAVENOUS
  Filled 2018-04-30 (×3): qty 10

## 2018-04-30 MED ORDER — SODIUM CHLORIDE 0.9 % IV SOLN: 250.0000 mL | INTRAVENOUS | Status: DC | PRN

## 2018-04-30 MED ORDER — SODIUM CHLORIDE 0.9 % IV SOLN
1.0000 g | Freq: Once | INTRAVENOUS | Status: AC
  Administered 2018-04-30: 1 g via INTRAVENOUS
  Filled 2018-04-30: qty 10

## 2018-04-30 MED ORDER — SODIUM CHLORIDE 0.9 % IV SOLN: 500.0000 mg | INTRAVENOUS | Status: DC

## 2018-04-30 NOTE — ED Notes (Signed)
ED TO INPATIENT HANDOFF REPORT  ED Nurse Name and Phone #: Steward Drone [phone]  S Name/Age/Gender Louis Harris 84 y.o. male Room/Bed: 033C/033C  Code Status   Code Status: Not on file  Home/SNF/Other Rehab Patient oriented to: self and time Is this baseline? y  Triage Complete: Triage complete  Chief Complaint AMS  Triage Note Patient presents to ED via PTAR , Health care provider states  Patient had his legs laying off the bed and his upper body was on the bed this am. States patient wasn't acting his norm. States he was fine yest . Recent dx. of parkinson. Presently alert , states he thinks his is across the street from the hospital .    Allergies Allergies  Allergen Reactions  . Diovan [Valsartan] Other (See Comments)    diarrhea  . Prilosec [Omeprazole] Other (See Comments)    diarrhea    Level of Care/Admitting Diagnosis ED Disposition    ED Disposition Condition Comment   Admit  Hospital Area: [facility] [[id number]]  Level of Care: Med-Surg [16]  I expect the patient will be discharged within 24 hours: Yes  LOW acuity---Tx typically complete <24 hrs---ACUTE conditions typically can be evaluated <24 hours---LABS likely to return to acceptable levels <24 hours---IS near functional baseline---EXPECTED to return to current living arrangement---NOT newly hypoxic: Meets criteria for 5C-Observation unit  Diagnosis: PNA (pneumonia) [591638]  Admitting Physician: Haydee Monica [[id number]]  Attending Physician: Tarry Kos A [[id number]]  PT Class (Do Not Modify): Observation [104]  PT Acc Code (Do Not Modify): Observation [10022]       B Medical/Surgery History Past Medical History:  Diagnosis Date  . Anxiety   . Arthritis   . Depression   . GERD (gastroesophageal reflux disease)   . Parkinson disease (HCC)   . Shortness of breath    exertion   Past Surgical History:  Procedure Laterality Date  . CATARACT EXTRACTION W/PHACO Left 12/12/2012   Procedure:  CATARACT EXTRACTION PHACO AND INTRAOCULAR LENS PLACEMENT (IOC);  Surgeon: Shade Flood, MD;  Location: [facility] OR;  Service: Ophthalmology;  Laterality: Left;  . CERVICAL SPINE SURGERY    . LEFT HEART CATHETERIZATION WITH CORONARY ANGIOGRAM N/A 01/21/2011   Procedure: LEFT HEART CATHETERIZATION WITH CORONARY ANGIOGRAM;  Surgeon: Donato Schultz, MD;  [facility];  Service: Cardiovascular;  Laterality: N/A;     A IV Location/Drains/Wounds Patient Lines/Drains/Airways Status   Active Line/Drains/Airways    Name:   Placement date:   Placement time:   Site:   Days:   Peripheral IV 04/30/18 Right Antecubital   04/30/18    1450    Antecubital   less than 1   Peripheral IV 04/30/18 Right Forearm   04/30/18    1502    Forearm   less than 1   Incision 12/12/12 Eye Left   12/12/12    1407     1965          Intake/Output Last 24 hours  Intake/Output Summary (Last 24 hours) at 04/30/2018 1857 Last data filed at 04/30/2018 1743 Gross per 24 hour  Intake 1100 ml  Output -  Net 1100 ml    Labs/Imaging Results for orders placed or performed during the hospital encounter of 04/30/18 (from the past 48 hour(s))  CBG monitoring, ED     Status: Abnormal   Collection Time: 04/30/18 12:34 PM  Result Value Ref Range   Glucose-Capillary 101 (H) 70 - 99 mg/dL   Comment 1 Call MD  NNP PA CNM   CBC with Differential     Status: Abnormal   Collection Time: 04/30/18  2:32 PM  Result Value Ref Range   WBC 5.2 4.0 - 10.5 K/uL   RBC 4.51 4.22 - 5.81 MIL/uL   Hemoglobin 13.9 13.0 - 17.0 g/dL   HCT 16.143.1 09.639.0 - 04.552.0 %   MCV 95.6 80.0 - 100.0 fL   MCH 30.8 26.0 - 34.0 pg   MCHC 32.3 30.0 - 36.0 g/dL   RDW 40.914.1 81.111.5 - 91.415.5 %   Platelets 136 (L) 150 - 400 K/uL   nRBC 0.0 0.0 - 0.2 %   Neutrophils Relative % 85 %   Neutro Abs 4.4 1.7 - 7.7 K/uL   Lymphocytes Relative 8 %   Lymphs Abs 0.4 (L) 0.7 - 4.0 K/uL   Monocytes Relative 7 %   Monocytes Absolute 0.4 0.1 - 1.0 K/uL   Eosinophils Relative 0 %    Eosinophils Absolute 0.0 0.0 - 0.5 K/uL   Basophils Relative 0 %   Basophils Absolute 0.0 0.0 - 0.1 K/uL   Immature Granulocytes 0 %   Abs Immature Granulocytes 0.01 0.00 - 0.07 K/uL    Comment: Performed at [facility], [address] [place], [place] [address]  Comprehensive metabolic panel     Status: None   Collection Time: 04/30/18  2:32 PM  Result Value Ref Range   Sodium 140 135 - 145 mmol/L   Potassium 3.9 3.5 - 5.1 mmol/L   Chloride 107 98 - 111 mmol/L   CO2 23 22 - 32 mmol/L   Glucose, Bld 99 70 - 99 mg/dL   BUN 11 8 - 23 mg/dL   Creatinine, Ser 6.210.84 0.61 - 1.24 mg/dL   Calcium 9.4 8.9 - 30.810.3 mg/dL   Total Protein 7.0 6.5 - 8.1 g/dL   Albumin 4.1 3.5 - 5.0 g/dL   AST 19 15 - 41 U/L   ALT 17 0 - 44 U/L   Alkaline Phosphatase 60 38 - 126 U/L   Total Bilirubin 0.9 0.3 - 1.2 mg/dL   GFR calc non Af Amer >60 >60 mL/min   GFR calc Af Amer >60 >60 mL/min   Anion gap 10 5 - 15    Comment: Performed at [facility], [address] [place], [place] [address]  Lactic acid, plasma     Status: None   Collection Time: 04/30/18  2:32 PM  Result Value Ref Range   Lactic Acid, Venous 1.3 0.5 - 1.9 mmol/L    Comment: Performed at [facility], [address] [place], [place] [address]  CBG monitoring, ED     Status: None   Collection Time: 04/30/18  2:36 PM  Result Value Ref Range   Glucose-Capillary 98 70 - 99 mg/dL  Urinalysis, Routine w reflex microscopic     Status: Abnormal   Collection Time: 04/30/18  5:30 PM  Result Value Ref Range   Color, Urine YELLOW YELLOW   APPearance CLEAR CLEAR   Specific Gravity, Urine 1.025 1.005 - 1.030   pH 6.0 5.0 - 8.0   Glucose, UA NEGATIVE NEGATIVE mg/dL   Hgb urine dipstick NEGATIVE NEGATIVE   Bilirubin Urine NEGATIVE NEGATIVE   Ketones, ur 40 (A) NEGATIVE mg/dL   Protein, ur NEGATIVE NEGATIVE mg/dL   Nitrite NEGATIVE NEGATIVE   Leukocytes,Ua NEGATIVE NEGATIVE    Comment: Microscopic not done on urines with  negative protein, blood, leukocytes, nitrite, or glucose <  500 mg/dL. Performed at [facility], [address] [place], [place] [address]    Dg Chest 2 View  Result Date: 04/30/2018 CLINICAL DATA:  Altered level of consciousness today. EXAM: CHEST - 2 VIEW COMPARISON:  02/28/2016 FINDINGS: Artifact overlies the chest. Mild cardiomegaly. Chronic aortic atherosclerosis. Abnormal density in the left lower lobe that could represent developing pneumonia. The remainder the chest is clear. No measurable effusion. No acute bone finding. IMPRESSION: Suspicion of developing left lower lobe pneumonia. Electronically Signed   By: Paulina Fusi M.D.   On: 04/30/2018 13:11   Ct Head Wo Contrast  Result Date: 04/30/2018 CLINICAL DATA:  84 year old male with altered level of consciousness. Initial encounter. EXAM: CT HEAD WITHOUT CONTRAST TECHNIQUE: Contiguous axial images were obtained from the base of the skull through the vertex without intravenous contrast. COMPARISON:  None. FINDINGS: Brain: Subtle hypodensity medial left temporal lobe (series 3, image 16). Although it is possible this represents partial volume averaging with adjacent cerebral spinal fluid, infarct or result of infection such as herpes can not be excluded. MR recommended for further delineation. No intracranial hemorrhage. Small area of encephalomalacia right frontal lobe unchanged may be related to remote infarct. Chronic microvascular changes. Global atrophy. No intracranial mass lesion noted on this unenhanced exam. Vascular: Vascular calcifications.  No acute hyperdense vessel. Skull: No acute calvarial abnormality. Sinuses/Orbits: No acute orbital abnormality. Post lens replacement. Minimal mucosal thickening inferior right maxillary sinus. Mastoid air cells and middle ear cavities are clear. Other: Rotation of the head and C1 upon C2. IMPRESSION: 1. Subtle hypodensity medial left temporal lobe (series 3, image 16). Although it is  possible this represents partial volume averaging with adjacent cerebral spinal fluid, infarct or result of infection such as herpes cannot be excluded. MR recommended for further delineation. 2. No intracranial hemorrhage. 3. Small area of encephalomalacia right frontal lobe unchanged may be related to remote infarct. 4. Chronic microvascular changes. 5. Global atrophy. 6. Rotation of the head and C1 upon C2. These results were called by telephone at the time of interpretation on 04/30/2018 at 1:33 pm to Dr. Jacalyn Lefevre , who verbally acknowledged these results. Electronically Signed   By: Lacy Duverney M.D.   On: 04/30/2018 13:46   Mr Laqueta Jean And Wo Contrast  Result Date: 04/30/2018 CLINICAL DATA:  Altered level of consciousness. EXAM: MRI HEAD WITHOUT AND WITH CONTRAST TECHNIQUE: Multiplanar, multiecho pulse sequences of the brain and surrounding structures were obtained without and with intravenous contrast. CONTRAST:  8 cc Gadavist COMPARISON:  Head CT same day.  MRI 04/12/2018 FINDINGS: Brain: Diffusion imaging does not show any acute or subacute infarction. The brainstem is normal. There are a few old small vessel cerebellar infarctions. Cerebral hemispheres show mild chronic small-vessel ischemic change of the deep white matter. There is an old small right frontal cortical and subcortical infarction. No evidence of mass lesion, hemorrhage, hydrocephalus or extra-axial collection. No mesial temporal abnormality as was questioned by CT. After contrast administration, no abnormal enhancement occurs. Vascular: Major vessels at the base of the brain show flow. Skull and upper cervical spine: Negative Sinuses/Orbits: Clear/normal Other: None IMPRESSION: No acute finding. Specifically, no evidence of left mesial temporal abnormality as was questioned by CT. Mild chronic small-vessel ischemic change. Age related atrophy. 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 Electronically Signed   By:  Paulina Fusi M.D.   On: 04/30/2018 17:09    Pending Labs Unresulted Labs (From admission, onward)  Start     Ordered   04/30/18 1357  Culture, blood (routine x 2)  BLOOD CULTURE X 2,   STAT     04/30/18 1356          Vitals/Pain Today's Vitals   04/30/18 1430 04/30/18 1445 04/30/18 1500 04/30/18 1530  BP: 127/76 134/65 128/68 127/67  Pulse: (!) 59 (!) 56  60  Resp: (!) Temp:      TempSrc:      SpO2: 100% 100%  98%  PainSc:        Isolation Precautions No active isolations  Medications Medications  sodium chloride 0.9 % bolus 1,000 mL (0 mLs Intravenous Stopped 04/30/18 1743)  cefTRIAXone (ROCEPHIN) 1 g in sodium chloride 0.9 % 100 mL IVPB (0 g Intravenous Stopped 04/30/18 1539)  azithromycin (ZITHROMAX) 500 mg in sodium chloride 0.9 % 250 mL IVPB (500 mg Intravenous New Bag/Given 04/30/18 1656)  gadobutrol (GADAVIST) 1 MMOL/ML injection 8 mL (8 mLs Intravenous Contrast Given 04/30/18 1629)    Mobility walks with device High fall risk   Focused Assessments Neuro Assessment Handoff:  Swallow screen pass? swallow screen not ordered         Neuro Assessment: Exceptions to WDL Neuro Checks:      Last Documented NIHSS Modified Score:   Has TPA been given? No If patient is a Neuro Trauma and patient is going to OR before floor call report to 4N Charge nurse: [phone] or [phone]     R Recommendations: See Admitting Provider Note  Report given to:   Additional Notes

## 2018-04-30 NOTE — ED Provider Notes (Signed)
Patient was seen by Dr. Particia Nearing.  Please see her note.  MRI is negative for acute stroke.  Admission for left lower lobe pneumonia.   Linwood Dibbles, MD 04/30/18 [phone]

## 2018-04-30 NOTE — Progress Notes (Signed)
Pt arrived to [address]. Alert and oriented to self and place, disoriented to date and situation. Pt identified appropriately and physical assessment completed. VS stable, denied pain and discomfort. No signs of acute distress.  Pt oriented to room and equipment instructed to use call bell for assistance, pt verbalized understanding and call bell left within reach.  Ms. Mayford Knife, [place] and pt cousin from [place] called and with pt agreement password has been established.  Bed alarm in place, will continue to monitor and treat pt per MD orders.

## 2018-04-30 NOTE — ED Notes (Signed)
Patient transported to MRI 

## 2018-04-30 NOTE — H&P (Signed)
History and Physical    Louis Harris MZT:[id number] DOB: 07/23/1934 DOA: 04/30/2018  PCP: Anne Ng, NP  Patient coming from: Assisted living  Chief Complaint: Confusion  HPI: Louis Harris is a 84 y.o. male with medical history significant of Parkinson's lives in assisted living comes in with confusion what his baseline is normal.  He denies any fevers or chills that he is aware of.  He has been feeling more weak than usual.  Initially was concerned that he had a stroke.  He denies any cough or lower extremity edema or swelling.  Patient found to have pneumonia referred for admission for such.  Afebrile vital signs stable with normal O2 sats.  Review of Systems: As per HPI otherwise 10 point review of systems negative.   Past Medical History:  Diagnosis Date   Anxiety    Arthritis    Depression    GERD (gastroesophageal reflux disease)    Parkinson disease (HCC)    Shortness of breath    exertion    Past Surgical History:  Procedure Laterality Date   CATARACT EXTRACTION W/PHACO Left 12/12/2012   Procedure: CATARACT EXTRACTION PHACO AND INTRAOCULAR LENS PLACEMENT (IOC);  Surgeon: Shade Flood, MD;  Location: [facility] OR;  Service: Ophthalmology;  Laterality: Left;   CERVICAL SPINE SURGERY     LEFT HEART CATHETERIZATION WITH CORONARY ANGIOGRAM N/A 01/21/2011   Procedure: LEFT HEART CATHETERIZATION WITH CORONARY ANGIOGRAM;  Surgeon: Donato Schultz, MD;  Location: [facility] CATH LAB;  Service: Cardiovascular;  Laterality: N/A;     reports that he has never smoked. He has never used smokeless tobacco. He reports that he does not drink alcohol or use drugs.  Allergies  Allergen Reactions   Diovan [Valsartan] Other (See Comments)    diarrhea   Prilosec [Omeprazole] Other (See Comments)    diarrhea    Family History  Problem Relation Age of Onset   Heart attack Father    Lupus Sister     Prior to Admission medications   Medication Sig Start Date End Date Taking?  Authorizing Provider  albuterol (PROVENTIL HFA;VENTOLIN HFA) 108 (90 BASE) MCG/ACT inhaler Inhale 2 puffs into the lungs every 6 (six) hours as needed for wheezing.   Yes [provider]  aspirin EC 81 MG tablet Take 81 mg by mouth daily.     Yes [provider]  calcium citrate-vitamin D (CITRACAL+D) 315-200 MG-UNIT tablet Take 1 tablet by mouth daily.    Yes [provider]  calcium gluconate 500 MG tablet Take 500 mg by mouth daily.    Yes [provider]  carboxymethylcellulose (REFRESH PLUS) 0.5 % SOLN Place 1 drop into both eyes daily as needed (dry eyes).   Yes [provider]  cycloSPORINE (RESTASIS) 0.05 % ophthalmic emulsion Place 1 drop into both eyes 2 (two) times daily.   Yes [provider]  doxazosin (CARDURA) 4 MG tablet Take 4 mg by mouth daily.    Yes [provider]  finasteride (PROSCAR) 5 MG tablet Take 5 mg by mouth daily.   Yes [provider]  fish oil-omega-3 fatty acids 1000 MG capsule Take 1 g by mouth daily.     Yes [provider]  fluticasone (FLONASE) 50 MCG/ACT nasal spray Place 1 spray into both nostrils daily as needed for allergies or rhinitis.   Yes [provider]  gabapentin (NEURONTIN) 100 MG capsule Take 100 mg by mouth at bedtime.    Yes [provider]  meloxicam (MOBIC) 15  MG tablet Take 15 mg by mouth daily.    Yes [provider]  memantine (NAMENDA) 10 MG tablet Take 10 mg by mouth 2 (two) times daily.   Yes [provider]  Multiple Vitamins-Minerals (MULTIVITAMINS THER. W/MINERALS) TABS Take 1 tablet by mouth daily.     Yes [provider]  pantoprazole (PROTONIX) 40 MG tablet Take 40 mg by mouth daily.   Yes [provider]  Polyethyl Glycol-Propyl Glycol (SYSTANE) 0.4-0.3 % GEL ophthalmic gel Place 1 application 2 (two) times daily as needed into both eyes (dryness).   Yes [provider]  pramipexole  (MIRAPEX) 0.125 MG tablet Take 0.25 mg by mouth at bedtime.    Yes [provider]  rivastigmine (EXELON) 3 MG capsule Take 3 mg by mouth See admin instructions. Start date 02/28/18 3 mg for 2 weeks daily 3 mg BID for 2 weeks 3 mg daily and 6 mg in the evening at supper for 2 weeks 6 mg twice a day   Yes [provider]  acetaminophen (TYLENOL) 500 MG tablet Take 2 tablets (1,000 mg total) by mouth every 6 (six) hours as needed. Patient not taking: Reported on 04/30/2018 02/28/16   Arby Barrette, MD  ibuprofen (ADVIL,MOTRIN) 200 MG tablet Take 200-600 mg by mouth every 6 (six) hours as needed for pain.     [provider]  moxifloxacin (VIGAMOX) 0.5 % ophthalmic solution Place 1 drop into the right eye 4 (four) times daily.     [provider]  ondansetron (ZOFRAN ODT) 4 MG disintegrating tablet Take 1 tablet (4 mg total) every 8 (eight) hours as needed by mouth for nausea or vomiting. Patient not taking: Reported on 04/13/2018 12/20/16   Melene Plan, DO    Physical Exam: Vitals:   04/30/18 1430 04/30/18 1445 04/30/18 1500 04/30/18 1530  BP: 127/76 134/65 128/68 127/67  Pulse: (!) 59 (!) 56  60  Resp: (!) Temp:      TempSrc:      SpO2: 100% 100%  98%      Constitutional: NAD, calm, comfortable Vitals:   04/30/18 1430 04/30/18 1445 04/30/18 1500 04/30/18 1530  BP: 127/76 134/65 128/68 127/67  Pulse: (!) 59 (!) 56  60  Resp: (!) Temp:      TempSrc:      SpO2: 100% 100%  98%   Eyes: PERRL, lids and conjunctivae normal ENMT: Mucous membranes are moist. Posterior pharynx clear of any exudate or lesions.Normal dentition.  Neck: normal, supple, no masses, no thyromegaly Respiratory: clear to auscultation bilaterally, no wheezing, no crackles. Normal respiratory effort. No accessory muscle use.  Cardiovascular: Regular rate and rhythm, no murmurs / rubs / gallops. No extremity edema. 2+ pedal pulses. No carotid bruits.   Abdomen: no tenderness, no masses palpated. No hepatosplenomegaly. Bowel sounds positive.  Musculoskeletal: no clubbing / cyanosis. No joint deformity upper and lower extremities. Good ROM, no contractures. Normal muscle tone.  Skin: no rashes, lesions, ulcers. No induration Neurologic: CN 2-12 grossly intact. Sensation intact, DTR normal. Strength 5/5 in all 4.  Psychiatric: Normal judgment and insight. Alert and oriented x 3. Normal mood.    Labs on Admission: I have personally reviewed following labs and imaging studies  CBC: Recent Labs  Lab 04/30/18 1432  WBC 5.2  NEUTROABS 4.4  HGB 13.9  HCT 43.1  MCV 95.6  PLT 136*   Basic Metabolic Panel: Recent Labs  Lab 04/30/18  1432  NA 140  K 3.9  CL 107  CO2 23  GLUCOSE 99  BUN 11  CREATININE 0.84  CALCIUM 9.4   GFR: CrCl cannot be calculated (Unknown ideal weight.). Liver Function Tests: Recent Labs  Lab 04/30/18 1432  AST 19  ALT 17  ALKPHOS 60  BILITOT 0.9  PROT 7.0  ALBUMIN 4.1   No results for input(s): LIPASE, AMYLASE in the last 168 hours. No results for input(s): AMMONIA in the last 168 hours. Coagulation Profile: No results for input(s): INR, PROTIME in the last 168 hours. Cardiac Enzymes: No results for input(s): CKTOTAL, CKMB, CKMBINDEX, TROPONINI in the last 168 hours. BNP (last 3 results) No results for input(s): PROBNP in the last 8760 hours. HbA1C: No results for input(s): HGBA1C in the last 72 hours. CBG: Recent Labs  Lab 04/30/18 1234 04/30/18 1436  GLUCAP 101* 98   Lipid Profile: No results for input(s): CHOL, HDL, LDLCALC, TRIG, CHOLHDL, LDLDIRECT in the last 72 hours. Thyroid Function Tests: No results for input(s): TSH, T4TOTAL, FREET4, T3FREE, THYROIDAB in the last 72 hours. Anemia Panel: No results for input(s): VITAMINB12, FOLATE, FERRITIN, TIBC, IRON, RETICCTPCT in the last 72 hours. Urine analysis:    Component Value Date/Time   COLORURINE YELLOW 04/30/2018 1730    APPEARANCEUR CLEAR 04/30/2018 1730   LABSPEC 1.025 04/30/2018 1730   PHURINE 6.0 04/30/2018 1730   GLUCOSEU NEGATIVE 04/30/2018 1730   HGBUR NEGATIVE 04/30/2018 1730   BILIRUBINUR NEGATIVE 04/30/2018 1730   KETONESUR 40 (A) 04/30/2018 1730   PROTEINUR NEGATIVE 04/30/2018 1730   NITRITE NEGATIVE 04/30/2018 1730   LEUKOCYTESUR NEGATIVE 04/30/2018 1730   Sepsis Labs: !!!!!!!!!!!!!!!!!!!!!!!!!!!!!!!!!!!!!!!!!!!! (procalcitonin:4,lacticidven:4) )No results found for this or any previous visit (from the past 240 hour(s)).   Radiological Exams on Admission: Dg Chest 2 View  Result Date: 04/30/2018 CLINICAL DATA:  Altered level of consciousness today. EXAM: CHEST - 2 VIEW COMPARISON:  02/28/2016 FINDINGS: Artifact overlies the chest. Mild cardiomegaly. Chronic aortic atherosclerosis. Abnormal density in the left lower lobe that could represent developing pneumonia. The remainder the chest is clear. No measurable effusion. No acute bone finding. IMPRESSION: Suspicion of developing left lower lobe pneumonia. Electronically Signed   By: Paulina Fusi M.D.   On: 04/30/2018 13:11   Ct Head Wo Contrast  Result Date: 04/30/2018 CLINICAL DATA:  84 year old male with altered level of consciousness. Initial encounter. EXAM: CT HEAD WITHOUT CONTRAST TECHNIQUE: Contiguous axial images were obtained from the base of the skull through the vertex without intravenous contrast. COMPARISON:  None. FINDINGS: Brain: Subtle hypodensity medial left temporal lobe (series 3, image 16). Although it is possible this represents partial volume averaging with adjacent cerebral spinal fluid, infarct or result of infection such as herpes can not be excluded. MR recommended for further delineation. No intracranial hemorrhage. Small area of encephalomalacia right frontal lobe unchanged may be related to remote infarct. Chronic microvascular changes. Global atrophy. No intracranial mass lesion noted on this unenhanced exam.  Vascular: Vascular calcifications.  No acute hyperdense vessel. Skull: No acute calvarial abnormality. Sinuses/Orbits: No acute orbital abnormality. Post lens replacement. Minimal mucosal thickening inferior right maxillary sinus. Mastoid air cells and middle ear cavities are clear. Other: Rotation of the head and C1 upon C2. IMPRESSION: 1. Subtle hypodensity medial left temporal lobe (series 3, image 16). Although it is possible this represents partial volume averaging with adjacent cerebral spinal fluid, infarct or result of infection such as herpes cannot be excluded. MR recommended for further delineation. 2. No  intracranial hemorrhage. 3. Small area of encephalomalacia right frontal lobe unchanged may be related to remote infarct. 4. Chronic microvascular changes. 5. Global atrophy. 6. Rotation of the head and C1 upon C2. These results were called by telephone at the time of interpretation on 04/30/2018 at 1:33 pm to Dr. Jacalyn Lefevre , who verbally acknowledged these results. Electronically Signed   By: Lacy Duverney M.D.   On: 04/30/2018 13:46   Mr Laqueta Jean And Wo Contrast  Result Date: 04/30/2018 CLINICAL DATA:  Altered level of consciousness. EXAM: MRI HEAD WITHOUT AND WITH CONTRAST TECHNIQUE: Multiplanar, multiecho pulse sequences of the brain and surrounding structures were obtained without and with intravenous contrast. CONTRAST:  8 cc Gadavist COMPARISON:  Head CT same day.  MRI 04/12/2018 FINDINGS: Brain: Diffusion imaging does not show any acute or subacute infarction. The brainstem is normal. There are a few old small vessel cerebellar infarctions. Cerebral hemispheres show mild chronic small-vessel ischemic change of the deep white matter. There is an old small right frontal cortical and subcortical infarction. No evidence of mass lesion, hemorrhage, hydrocephalus or extra-axial collection. No mesial temporal abnormality as was questioned by CT. After contrast administration, no abnormal  enhancement occurs. Vascular: Major vessels at the base of the brain show flow. Skull and upper cervical spine: Negative Sinuses/Orbits: Clear/normal Other: None IMPRESSION: No acute finding. Specifically, no evidence of left mesial temporal abnormality as was questioned by CT. Mild chronic small-vessel ischemic change. Age related atrophy. 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 Electronically Signed   By: Paulina Fusi M.D.   On: 04/30/2018 17:09   Old chart reviewed Case discussed with EDP Chest x-ray shows left infiltrate  Assessment/Plan 84 year old overall healthy male in assisted living comes in with community-acquired pneumonia Principal Problem:   PNA (pneumonia)-obtain blood and sputum cultures.  Patient not septic.  Lactic acid normal.  Vitals stable with normal O2 sats.  Placed on Rocephin and azithromycin.  Active Problems:   Acute metabolic encephalopathy-likely secondary to infection above.  Monitor overnight on medical floor.  Mental status clear at this time.  Likely spiking fevers intermittently.    Pure hypercholesterolemia-stable    GERD (gastroesophageal reflux disease)-stable   Med rec pending pharmacy review   DVT prophylaxis: SCDs Code Status: Full Family Communication: None Disposition Plan: 1 to 2 days Consults called: None Admission status: Observation   Kiptyn Rafuse A MD [facility]  If 7PM-7AM, please contact night-coverage [email or web address] Password [facility]  04/30/2018, 7:06 PM

## 2018-04-30 NOTE — ED Provider Notes (Signed)
[facility] EMERGENCY DEPARTMENT Provider Note   CSN: [id number] Arrival date & time: 04/30/18  1230    History   Chief Complaint Chief Complaint  Patient presents with   Altered Mental Status    HPI Louis Harris is a 84 y.o. male.     Pt presents to the ED today with AMS.  Pt is unable to give much hx, but he was here on 2/28 for the same.  More history obtained by caregiver.  Closest relative (cousin) lives in [place].  Pt lives in independent living.  He has not been eating unless she is there.  She goes by once a day.  She said he has been unable to dress himself.  She has not noticed a fever.  Today, she came into his room and he was half on and half off the bed and had not eaten anything since last evening.     Past Medical History:  Diagnosis Date   Anxiety    Arthritis    Depression    GERD (gastroesophageal reflux disease)    Parkinson disease (HCC)    Shortness of breath    exertion    Patient Active Problem List   Diagnosis Date Noted   Anxiety    Depression    GERD (gastroesophageal reflux disease)    Arthritis    Shortness of breath 01/21/2011   Pure hypercholesterolemia 01/21/2011    Past Surgical History:  Procedure Laterality Date   CATARACT EXTRACTION W/PHACO Left 12/12/2012   Procedure: CATARACT EXTRACTION PHACO AND INTRAOCULAR LENS PLACEMENT (IOC);  Surgeon: Shade Flood, MD;  Location: [facility] OR;  Service: Ophthalmology;  Laterality: Left;   CERVICAL SPINE SURGERY     LEFT HEART CATHETERIZATION WITH CORONARY ANGIOGRAM N/A 01/21/2011   Procedure: LEFT HEART CATHETERIZATION WITH CORONARY ANGIOGRAM;  Surgeon: Donato Schultz, MD;  Location: [facility] CATH LAB;  Service: Cardiovascular;  Laterality: N/A;        Home Medications    Prior to Admission medications   Medication Sig Start Date End Date Taking? Authorizing Provider  acetaminophen (TYLENOL) 500 MG tablet Take 2 tablets (1,000 mg total) by mouth every 6  (six) hours as needed. 02/28/16   Arby Barrette, MD  albuterol (PROVENTIL HFA;VENTOLIN HFA) 108 (90 BASE) MCG/ACT inhaler Inhale 2 puffs into the lungs every 6 (six) hours as needed for wheezing.    [provider]  aspirin EC 81 MG tablet Take 81 mg by mouth daily.      [provider]  calcium citrate-vitamin D (CITRACAL+D) 315-200 MG-UNIT tablet Take 1 tablet by mouth daily.     [provider]  calcium gluconate 500 MG tablet Take 500 mg by mouth daily.     [provider]  donepezil (ARICEPT) 10 MG tablet Take 10 mg by mouth at bedtime.  10/23/15   [provider]  doxazosin (CARDURA) 4 MG tablet Take 4 mg by mouth daily.     [provider]  finasteride (PROSCAR) 5 MG tablet Take 5 mg by mouth daily.    [provider]  fish oil-omega-3 fatty acids 1000 MG capsule Take 1 g by mouth daily.      [provider]  gabapentin (NEURONTIN) 100 MG capsule Take 300 mg by mouth at bedtime.     [provider]  ibuprofen (ADVIL,MOTRIN) 200 MG tablet Take 200-600 mg by mouth every 6 (six) hours as needed for pain.     [provider]  meloxicam (MOBIC) 15  MG tablet Take 15 mg by mouth at bedtime.    [provider]  moxifloxacin (VIGAMOX) 0.5 % ophthalmic solution Place 1 drop into the right eye 4 (four) times daily.     [provider]  Multiple Vitamins-Minerals (ICAPS PO) Take 1 tablet by mouth 2 (two) times daily.    [provider]  Multiple Vitamins-Minerals (MULTIVITAMINS THER. W/MINERALS) TABS Take 1 tablet by mouth daily.      [provider]  ondansetron (ZOFRAN ODT) 4 MG disintegrating tablet Take 1 tablet (4 mg total) every 8 (eight) hours as needed by mouth for nausea or vomiting. Patient not taking: Reported on 04/13/2018 12/20/16   Melene PlanFloyd, Dan, DO  Polyethyl Glycol-Propyl Glycol (SYSTANE) 0.4-0.3 % GEL ophthalmic gel Place 1 application 2 (two) times daily as needed into  both eyes (dryness).    [provider]  pramipexole (MIRAPEX) 0.125 MG tablet Take 0.25 mg by mouth at bedtime.     [provider]  rivastigmine (EXELON) 3 MG capsule Take 3 mg by mouth See admin instructions. Start date 02/28/18 3 mg for 2 weeks daily 3 mg BID for 2 weeks 3 mg daily and 6 mg in the evening at supper for 2 weeks 6 mg twice a day    [provider]    Family History Family History  Problem Relation Age of Onset   Heart attack Father    Lupus Sister     Social History Social History   Tobacco Use   Smoking status: Never Smoker   Smokeless tobacco: Never Used  Substance Use Topics   Alcohol use: No   Drug use: No     Allergies   Diovan [valsartan] and Prilosec [omeprazole]   Review of Systems Review of Systems  Unable to perform ROS: Dementia     Physical Exam Updated Vital Signs BP 128/68    Pulse (!) 56    Temp 98.4 F (36.9 C) (Oral)    Resp 14    SpO2 100%   Physical Exam Vitals signs and nursing note reviewed.  Constitutional:      Appearance: Normal appearance.  HENT:     Head: Normocephalic and atraumatic.     Right Ear: External ear normal.     Left Ear: External ear normal.     Nose: Nose normal.     Mouth/Throat:     Mouth: Mucous membranes are moist.  Eyes:     Extraocular Movements: Extraocular movements intact.     Pupils: Pupils are equal, round, and reactive to light.  Neck:     Musculoskeletal: Normal range of motion and neck supple.  Cardiovascular:     Rate and Rhythm: Normal rate and regular rhythm.     Pulses: Normal pulses.     Heart sounds: Normal heart sounds.  Pulmonary:     Effort: Pulmonary effort is normal.     Breath sounds: Normal breath sounds.  Abdominal:     General: Abdomen is flat.     Palpations: Abdomen is soft.  Musculoskeletal: Normal range of motion.  Skin:    General: Skin is warm.     Capillary Refill: Capillary refill takes less than 2 seconds.   Neurological:     Mental Status: He is alert.     Comments: Oriented to name and to place.  He is moving all 4 extremities and is following commands.        ED Treatments / Results  Labs (all labs ordered are  listed, but only abnormal results are displayed) Labs Reviewed  CBC WITH DIFFERENTIAL/PLATELET - Abnormal; Notable for the following components:      Result Value   Platelets 136 (*)    Lymphs Abs 0.4 (*)    All other components within normal limits  CBG MONITORING, ED - Abnormal; Notable for the following components:   Glucose-Capillary 101 (*)    All other components within normal limits  CULTURE, BLOOD (ROUTINE X 2)  CULTURE, BLOOD (ROUTINE X 2)  COMPREHENSIVE METABOLIC PANEL  URINALYSIS, ROUTINE W REFLEX MICROSCOPIC  LACTIC ACID, PLASMA  LACTIC ACID, PLASMA  CBG MONITORING, ED    EKG EKG Interpretation  Date/Time:  Monday April 30 2018 12:37:21 EDT Ventricular Rate:  59 PR Interval:    QRS Duration: 133 QT Interval:  453 QTC Calculation: 449 R Axis:   -60 Text Interpretation:  Sinus rhythm RBBB and LAFB LVH by voltage No significant change since last tracing Confirmed by Jacalyn Lefevre [phone]) on 04/30/2018 12:41:32 PM Also confirmed by Jacalyn Lefevre [phone]), editor Barbette Hair [phone])  on 04/30/2018 1:20:54 PM   Radiology Dg Chest 2 View  Result Date: 04/30/2018 CLINICAL DATA:  Altered level of consciousness today. EXAM: CHEST - 2 VIEW COMPARISON:  02/28/2016 FINDINGS: Artifact overlies the chest. Mild cardiomegaly. Chronic aortic atherosclerosis. Abnormal density in the left lower lobe that could represent developing pneumonia. The remainder the chest is clear. No measurable effusion. No acute bone finding. IMPRESSION: Suspicion of developing left lower lobe pneumonia. Electronically Signed   By: Paulina Fusi M.D.   On: 04/30/2018 13:11   Ct Head Wo Contrast  Result Date: 04/30/2018 CLINICAL DATA:  84 year old male with altered level of consciousness.  Initial encounter. EXAM: CT HEAD WITHOUT CONTRAST TECHNIQUE: Contiguous axial images were obtained from the base of the skull through the vertex without intravenous contrast. COMPARISON:  None. FINDINGS: Brain: Subtle hypodensity medial left temporal lobe (series 3, image 16). Although it is possible this represents partial volume averaging with adjacent cerebral spinal fluid, infarct or result of infection such as herpes can not be excluded. MR recommended for further delineation. No intracranial hemorrhage. Small area of encephalomalacia right frontal lobe unchanged may be related to remote infarct. Chronic microvascular changes. Global atrophy. No intracranial mass lesion noted on this unenhanced exam. Vascular: Vascular calcifications.  No acute hyperdense vessel. Skull: No acute calvarial abnormality. Sinuses/Orbits: No acute orbital abnormality. Post lens replacement. Minimal mucosal thickening inferior right maxillary sinus. Mastoid air cells and middle ear cavities are clear. Other: Rotation of the head and C1 upon C2. IMPRESSION: 1. Subtle hypodensity medial left temporal lobe (series 3, image 16). Although it is possible this represents partial volume averaging with adjacent cerebral spinal fluid, infarct or result of infection such as herpes cannot be excluded. MR recommended for further delineation. 2. No intracranial hemorrhage. 3. Small area of encephalomalacia right frontal lobe unchanged may be related to remote infarct. 4. Chronic microvascular changes. 5. Global atrophy. 6. Rotation of the head and C1 upon C2. These results were called by telephone at the time of interpretation on 04/30/2018 at 1:33 pm to Dr. Jacalyn Lefevre , who verbally acknowledged these results. Electronically Signed   By: Lacy Duverney M.D.   On: 04/30/2018 13:46    Procedures Procedures (including critical care time)  Medications Ordered in ED Medications  azithromycin (ZITHROMAX) 500 mg in sodium chloride 0.9 % 250 mL  IVPB (has no administration in time range)  sodium chloride 0.9 % bolus 1,000 mL (  1,000 mLs Intravenous New Bag/Given 04/30/18 1506)  cefTRIAXone (ROCEPHIN) 1 g in sodium chloride 0.9 % 100 mL IVPB (1 g Intravenous New Bag/Given 04/30/18 1509)     Initial Impression / Assessment and Plan / ED Course  I have reviewed the triage vital signs and the nursing notes.  Pertinent labs & imaging results that were available during my care of the patient were reviewed by me and considered in my medical decision making (see chart for details).       CXR shows possible LLL pneumonia, so he was given rocephin and zithromax.  The pt's labs are pending.  MRI pending.  Anticipate admission.  Pt signed out to Dr. Lynelle Doctor pending results.  Final Clinical Impressions(s) / ED Diagnoses   Final diagnoses:  Altered mental status, unspecified altered mental status type  Community acquired pneumonia of left lower lobe of lung [facility])    ED Discharge Orders    None       Jacalyn Lefevre, MD 04/30/18 1554

## 2018-04-30 NOTE — ED Triage Notes (Signed)
Patient presents to ED via [facility] , Health care provider states  Patient had his legs laying off the bed and his upper body was on the bed this am. States patient wasn't acting his norm. States he was fine yest . Recent dx. of parkinson. Presently alert , states he thinks his is across the street from the hospital .

## 2018-05-01 ENCOUNTER — Other Ambulatory Visit: Payer: Self-pay

## 2018-05-01 DIAGNOSIS — Z888 Allergy status to other drugs, medicaments and biological substances status: Secondary | ICD-10-CM | POA: Diagnosis not present

## 2018-05-01 DIAGNOSIS — E7800 Pure hypercholesterolemia, unspecified: Secondary | ICD-10-CM | POA: Diagnosis present

## 2018-05-01 DIAGNOSIS — D696 Thrombocytopenia, unspecified: Secondary | ICD-10-CM | POA: Diagnosis present

## 2018-05-01 DIAGNOSIS — J189 Pneumonia, unspecified organism: Secondary | ICD-10-CM | POA: Diagnosis present

## 2018-05-01 DIAGNOSIS — M1990 Unspecified osteoarthritis, unspecified site: Secondary | ICD-10-CM | POA: Diagnosis present

## 2018-05-01 DIAGNOSIS — F419 Anxiety disorder, unspecified: Secondary | ICD-10-CM | POA: Diagnosis present

## 2018-05-01 DIAGNOSIS — R5381 Other malaise: Secondary | ICD-10-CM | POA: Diagnosis present

## 2018-05-01 DIAGNOSIS — R339 Retention of urine, unspecified: Secondary | ICD-10-CM | POA: Diagnosis present

## 2018-05-01 DIAGNOSIS — J181 Lobar pneumonia, unspecified organism: Secondary | ICD-10-CM

## 2018-05-01 DIAGNOSIS — G9341 Metabolic encephalopathy: Secondary | ICD-10-CM | POA: Diagnosis not present

## 2018-05-01 DIAGNOSIS — J690 Pneumonitis due to inhalation of food and vomit: Secondary | ICD-10-CM | POA: Diagnosis present

## 2018-05-01 DIAGNOSIS — K219 Gastro-esophageal reflux disease without esophagitis: Secondary | ICD-10-CM | POA: Diagnosis present

## 2018-05-01 DIAGNOSIS — G20 Parkinson's disease: Secondary | ICD-10-CM | POA: Diagnosis not present

## 2018-05-01 DIAGNOSIS — R4182 Altered mental status, unspecified: Secondary | ICD-10-CM | POA: Diagnosis present

## 2018-05-01 DIAGNOSIS — Z8249 Family history of ischemic heart disease and other diseases of the circulatory system: Secondary | ICD-10-CM | POA: Diagnosis not present

## 2018-05-01 DIAGNOSIS — F0280 Dementia in other diseases classified elsewhere without behavioral disturbance: Secondary | ICD-10-CM | POA: Diagnosis present

## 2018-05-01 DIAGNOSIS — Z961 Presence of intraocular lens: Secondary | ICD-10-CM | POA: Diagnosis present

## 2018-05-01 DIAGNOSIS — Z832 Family history of diseases of the blood and blood-forming organs and certain disorders involving the immune mechanism: Secondary | ICD-10-CM | POA: Diagnosis not present

## 2018-05-01 DIAGNOSIS — Z9842 Cataract extraction status, left eye: Secondary | ICD-10-CM | POA: Diagnosis not present

## 2018-05-01 DIAGNOSIS — Z791 Long term (current) use of non-steroidal anti-inflammatories (NSAID): Secondary | ICD-10-CM | POA: Diagnosis not present

## 2018-05-01 DIAGNOSIS — Z79899 Other long term (current) drug therapy: Secondary | ICD-10-CM | POA: Diagnosis not present

## 2018-05-01 DIAGNOSIS — F329 Major depressive disorder, single episode, unspecified: Secondary | ICD-10-CM | POA: Diagnosis present

## 2018-05-01 DIAGNOSIS — Z7982 Long term (current) use of aspirin: Secondary | ICD-10-CM | POA: Diagnosis not present

## 2018-05-01 LAB — CBC WITH DIFFERENTIAL/PLATELET
ABS IMMATURE GRANULOCYTES: 0.02 10*3/uL (ref 0.00–0.07)
Basophils Absolute: 0 10*3/uL (ref 0.0–0.1)
Basophils Relative: 0 %
Eosinophils Absolute: 0 10*3/uL (ref 0.0–0.5)
Eosinophils Relative: 1 %
HCT: 38.7 % — ABNORMAL LOW (ref 39.0–52.0)
Hemoglobin: 12.3 g/dL — ABNORMAL LOW (ref 13.0–17.0)
Immature Granulocytes: 0 %
Lymphocytes Relative: 24 %
Lymphs Abs: 1.3 10*3/uL (ref 0.7–4.0)
MCH: 30.1 pg (ref 26.0–34.0)
MCHC: 31.8 g/dL (ref 30.0–36.0)
MCV: 94.9 fL (ref 80.0–100.0)
Monocytes Absolute: 0.7 10*3/uL (ref 0.1–1.0)
Monocytes Relative: 13 %
NEUTROS ABS: 3.3 10*3/uL (ref 1.7–7.7)
Neutrophils Relative %: 62 %
Platelets: 117 10*3/uL — ABNORMAL LOW (ref 150–400)
RBC: 4.08 MIL/uL — ABNORMAL LOW (ref 4.22–5.81)
RDW: 14.1 % (ref 11.5–15.5)
WBC: 5.3 10*3/uL (ref 4.0–10.5)
nRBC: 0 % (ref 0.0–0.2)

## 2018-05-01 LAB — MRSA PCR SCREENING: MRSA by PCR: NEGATIVE

## 2018-05-01 LAB — BASIC METABOLIC PANEL
Anion gap: 9 (ref 5–15)
BUN: 9 mg/dL (ref 8–23)
CO2: 24 mmol/L (ref 22–32)
Calcium: 8.9 mg/dL (ref 8.9–10.3)
Chloride: 108 mmol/L (ref 98–111)
Creatinine, Ser: 0.82 mg/dL (ref 0.61–1.24)
GFR calc Af Amer: >60 mL/min (ref >60)
GFR calc non Af Amer: >60 mL/min (ref >60)
Glucose, Bld: 84 mg/dL (ref 70–99)
Potassium: 4 mmol/L (ref 3.5–5.1)
Sodium: 141 mmol/L (ref 135–145)

## 2018-05-01 MED ORDER — BISACODYL 10 MG RE SUPP
10.0000 mg | Freq: Once | RECTAL | Status: AC
  Administered 2018-05-01: 10 mg via RECTAL
  Filled 2018-05-01: qty 1

## 2018-05-01 MED ORDER — PRAMIPEXOLE DIHYDROCHLORIDE 0.25 MG PO TABS
0.2500 mg | ORAL_TABLET | Freq: Every day | ORAL | Status: DC
  Administered 2018-05-01 – 2018-05-03 (×3): 0.25 mg via ORAL
  Filled 2018-05-01 (×3): qty 1

## 2018-05-01 MED ORDER — DOXAZOSIN MESYLATE 4 MG PO TABS
4.0000 mg | ORAL_TABLET | Freq: Every day | ORAL | Status: DC
  Administered 2018-05-01 – 2018-05-03 (×3): 4 mg via ORAL
  Filled 2018-05-01 (×3): qty 1

## 2018-05-01 MED ORDER — ASPIRIN EC 81 MG PO TBEC
81.0000 mg | DELAYED_RELEASE_TABLET | Freq: Every day | ORAL | Status: DC
  Administered 2018-05-01 – 2018-05-04 (×4): 81 mg via ORAL
  Filled 2018-05-01 (×4): qty 1

## 2018-05-01 MED ORDER — RIVASTIGMINE TARTRATE 1.5 MG PO CAPS
3.0000 mg | ORAL_CAPSULE | Freq: Two times a day (BID) | ORAL | Status: DC
  Administered 2018-05-01 – 2018-05-04 (×7): 3 mg via ORAL
  Filled 2018-05-01 (×7): qty 2

## 2018-05-01 MED ORDER — MEMANTINE HCL 10 MG PO TABS
10.0000 mg | ORAL_TABLET | Freq: Two times a day (BID) | ORAL | Status: DC
  Administered 2018-05-01 – 2018-05-04 (×7): 10 mg via ORAL
  Filled 2018-05-01 (×7): qty 1

## 2018-05-01 MED ORDER — DOCUSATE SODIUM 100 MG PO CAPS
100.0000 mg | ORAL_CAPSULE | Freq: Two times a day (BID) | ORAL | Status: DC
  Administered 2018-05-01 – 2018-05-04 (×5): 100 mg via ORAL
  Filled 2018-05-01 (×5): qty 1

## 2018-05-01 NOTE — TOC Initial Note (Signed)
Transition of Care [facility]) - Initial/Assessment Note    Patient Details  Name: Louis Harris MRN: [id number] Date of Birth: [birth year]  Transition of Care [facility]) CM/SW Contact:    Epifanio Lesches, RN Phone Number: 05/01/2018, 3:49 PM  Clinical Narrative:            From ILF. Closest relative/HCPOA (Althea/cousin) lives in [place]. Has caregiver.  Owns walker.      Maudry Mayhew (Other) Nelly Laurence ([phone]      PCP: Renford Dills  Expected Discharge Plan: Skilled Nursing Facility Barriers to Discharge: Continued Medical Work up   Patient Goals and CMS Choice Patient states their goals for this hospitalization and ongoing recovery are:: to get better and go home CMS Medicare.gov Compare Post Acute Care list provided to:: Patient Choice offered to / list presented to : Patient  Expected Discharge Plan and Services Expected Discharge Plan: Skilled Nursing Facility Discharge Planning Services: CM Consult   Living arrangements for the past 2 months: Single Family Home                  Prior Living Arrangements/Services Living arrangements for the past 2 months: Single Family Home Lives with:: Self Patient language and need for interpreter reviewed:: Yes Do you feel safe going back to the place where you live?: No      Need for Family Participation in Patient Care: Yes (Comment) Care giver support system in place?: Yes (comment)   Criminal Activity/Legal Involvement Pertinent to Current Situation/Hospitalization: No - Comment as needed  Activities of Daily Living Home Assistive Devices/Equipment: Walker (specify type) ADL Screening (condition at time of admission) Patient's cognitive ability adequate to safely complete daily activities?: No Is the patient deaf or have difficulty hearing?: No Does the patient have difficulty seeing, even when wearing glasses/contacts?: No Does the patient have difficulty concentrating, remembering, or  making decisions?: Yes Patient able to express need for assistance with ADLs?: Yes Does the patient have difficulty dressing or bathing?: Yes Independently performs ADLs?: No Communication: Independent Dressing (OT): Needs assistance Is this a change from baseline?: Pre-admission baseline Grooming: Needs assistance Is this a change from baseline?: Pre-admission baseline Feeding: Needs assistance Is this a change from baseline?: Pre-admission baseline Bathing: Needs assistance Is this a change from baseline?: Pre-admission baseline Toileting: Needs assistance Is this a change from baseline?: Pre-admission baseline In/Out Bed: Needs assistance Is this a change from baseline?: Pre-admission baseline Walks in Home: Needs assistance Is this a change from baseline?: Pre-admission baseline Weakness of Legs: Both Weakness of Arms/Hands: Both  Permission Sought/Granted Permission sought to share information with : Case Manager Permission granted to share information with : Yes, Verbal Permission Granted  Share Information with NAME: Althea Williams/ cousin/HCPOA([phone]) Deborah Phillips/ caregiver([phone])           Emotional Assessment       Orientation: : Oriented to Self, Oriented to Place Alcohol / Substance Use: Not Applicable Psych Involvement: No (comment)  Admission diagnosis:  Altered mental status, unspecified altered mental status type [R41.82] Community acquired pneumonia of left lower lobe of lung (HCC) [J18.1] Patient Active Problem List   Diagnosis Date Noted  . Pneumonia 05/01/2018  . PNA (pneumonia) 04/30/2018  . Acute metabolic encephalopathy 04/30/2018  . Anxiety   . Depression   . GERD (gastroesophageal reflux disease)   . Arthritis   . Shortness of breath 01/21/2011  . Pure hypercholesterolemia 01/21/2011   PCP:  Renford Dills, MD Pharmacy:  [id number] [place], [place] - [address] [place]  [place] [address] Phone: [phone] Fax: [phone]  [facility] - [place], [employer] - [address] [place] [address] [place] [address] Phone: [phone] Fax: [phone]     Social Determinants of Health (SDOH) Interventions    Readmission Risk Interventions  No flowsheet data found.

## 2018-05-01 NOTE — Progress Notes (Signed)
Physical Therapy Evaluation Patient Details Name: Louis Harris MRN: [id number] DOB: 20-Feb-1934 Today's Date: 05/01/2018   History of Present Illness  Patient is 84 y/o male presenting to hospital with confusion secondary to pneumonia and acute metabolic encephalopathy. MRI was negative for any acute abnormality. PMH includes GERD, Parkinson's, probable dementia per notes, and anxiety.  Clinical Impression  Patient admitted to hospital secondary to problems above and with deficits below. Patient required minA-modA to stand with RW. Patient unable to follow safety cues with RW therefore further mobility deferred. Patient is a very high fall risk and unsure of necessary caregiver assistance at home. Given cognitive and functional mobility deficits, recommending SNF level therapy following d/c. Patient will benefit from acute physical therapy to maximize independence and safety with functional mobility.     Follow Up Recommendations SNF;Supervision/Assistance - 24 hour    Equipment Recommendations  Other (comment)(TBD at next venue)    Recommendations for Other Services       Precautions / Restrictions Precautions Precautions: Fall Restrictions Weight Bearing Restrictions: No      Mobility  Bed Mobility               General bed mobility comments: Patient in chair upon arrival  Transfers Overall transfer level: Needs assistance Equipment used: Rolling walker (2 wheeled) Transfers: Sit to/from Stand Sit to Stand: Min assist;Mod assist         General transfer comment: Patient with significant unsteadiness and posterior lean during first attempt to stand requiring seated rest break. Required modA and unable to achieve full standing on first attempt. On second stand, patient required minA and able to maintain upright standing but unable to follow safety cues for use of RW therefore returned to sitting and further mobility deferred. Required minA to maintain standing  balance.  Ambulation/Gait                Stairs            Wheelchair Mobility    Modified Rankin (Stroke Patients Only)       Balance Overall balance assessment: Needs assistance Sitting-balance support: Bilateral upper extremity supported;Feet supported Sitting balance-Leahy Scale: Poor Sitting balance - Comments: Required BUE to maintain sitting balance   Standing balance support: Bilateral upper extremity supported Standing balance-Leahy Scale: Poor Standing balance comment: reliant on BUE support and external assistance to maintain standing balance                             Pertinent Vitals/Pain Pain Assessment: Faces Faces Pain Scale: No hurt    Home Living Family/patient expects to be discharged to:: Private residence Living Arrangements: Alone Available Help at Discharge: Family;Available PRN/intermittently Type of Home: Independent living facility Home Access: Level entry     Home Layout: One level Home Equipment: Walker - 2 wheels;Cane - single point Additional Comments: Unsure of accuracy of cognitive deficits and no family or caregiver present during session    Prior Function Level of Independence: Independent with assistive device(s)         Comments: reports occasionally using cane or RW for mobility. States he was independent with ADLs. Unsure of accuracy of PLOF secondary to cognitive deficits and no family or caregiver present     Hand Dominance        Extremity/Trunk Assessment   Upper Extremity Assessment Upper Extremity Assessment: Defer to OT evaluation    Lower Extremity Assessment Lower Extremity Assessment: Generalized weakness  Cervical / Trunk Assessment Cervical / Trunk Assessment: Normal  Communication   Communication: No difficulties  Cognition Arousal/Alertness: Awake/alert Behavior During Therapy: WFL for tasks assessed/performed Overall Cognitive Status: No family/caregiver present to  determine baseline cognitive functioning                                 General Comments: Patient only oriented to person. Patient reported being independent with ADLs and only occasionally using AD however unsure of accuracy secondary to cognitive deficits and previous notes. Patient required safety cues during session and required demonstration for performing ther ex tasks.      General Comments General comments (skin integrity, edema, etc.): No family or caregiver present during session. Patient required demonstration for proper exercise technique for HEP.     Exercises General Exercises - Lower Extremity Ankle Circles/Pumps: AROM;Both;20 reps;Seated Long Arc Quad: AROM;Both;10 reps;Seated Hip Flexion/Marching: AROM;Both;10 reps;Seated   Assessment/Plan    PT Assessment Patient needs continued PT services  PT Problem List Decreased strength;Decreased activity tolerance;Decreased balance;Decreased mobility;Decreased knowledge of use of DME;Decreased safety awareness       PT Treatment Interventions DME instruction;Gait training;Functional mobility training;Therapeutic exercise;Therapeutic activities;Balance training;Cognitive remediation;Patient/family education    PT Goals (Current goals can be found in the Care Plan section)  Acute Rehab PT Goals Patient Stated Goal: agreeable to work with therapy PT Goal Formulation: With patient Time For Goal Achievement: 05/15/18 Potential to Achieve Goals: Fair    Frequency Min 2X/week   Barriers to discharge Decreased caregiver support      Co-evaluation               AM-PAC PT "6 Clicks" Mobility  Outcome Measure Help needed turning from your back to your side while in a flat bed without using bedrails?: A Little Help needed moving from lying on your back to sitting on the side of a flat bed without using bedrails?: A Little Help needed moving to and from a bed to a chair (including a wheelchair)?: A  Little Help needed standing up from a chair using your arms (e.g., wheelchair or bedside chair)?: A Little Help needed to walk in hospital room?: Total Help needed climbing 3-5 steps with a railing? : Total 6 Click Score: 14    End of Session Equipment Utilized During Treatment: Gait belt Activity Tolerance: Patient tolerated treatment well Patient left: in chair;with call bell/phone within reach;with chair alarm set;with nursing/sitter in room Nurse Communication: Mobility status PT Visit Diagnosis: Unsteadiness on feet (R26.81);Other abnormalities of gait and mobility (R26.89);Muscle weakness (generalized) (M62.81);Difficulty in walking, not elsewhere classified (R26.2)    Time: 1610-9604 PT Time Calculation (min) (ACUTE ONLY): 16 min   Charges:   PT Evaluation $PT Eval Moderate Complexity: 1 Mod          Vanessa Ralphs, SPT  Vanessa Ralphs 05/01/2018, 5:18 PM

## 2018-05-01 NOTE — Progress Notes (Signed)
Pt urinated once since admittion, previous bladder scan showed 273 ml of urine in bladder. This morning bladder scan showed 513 ml of urine in bladder. Schorr, NP notified. Order for insertion of Foley catheter placed. Foley inserted at the bedside 5 [address] 33, for acute urinary retention by Cristobal Goldmann, RN assisted by Sunny Schlein, RN. Procedure explained to the pt, peri care performed, pt gown and bed pad changed. Sterile technique maintained throughout the procedure. Pt verbalized pain and discomfort initially, symptoms have improved once catheter was in place. Obtained 350 ml of clear, yellow urine.  Will continue to monitor pt.

## 2018-05-01 NOTE — NC FL2 (Signed)
[place] MEDICAID FL2 LEVEL OF CARE SCREENING TOOL     IDENTIFICATION  Patient Name: Louis Harris Birthdate: 04-28-1934 Sex: male Admission Date (Current Location): 04/30/2018  [facility] and [place] Number:  Producer, television/film/video and Address:  [facility], [address], [place], [place] [address]      Provider Number: [id number]  Attending Physician Name and Address:  Glade Lloyd, MD  Relative Name and Phone Number:  Vanita Ingles, [phone]    Current Level of Care: Hospital Recommended Level of Care: Skilled Nursing Facility Prior Approval Number:    Date Approved/Denied:   PASRR Number: Pending  Discharge Plan: SNF    Current Diagnoses: Patient Active Problem List   Diagnosis Date Noted  . Pneumonia 05/01/2018  . PNA (pneumonia) 04/30/2018  . Acute metabolic encephalopathy 04/30/2018  . Anxiety   . Depression   . GERD (gastroesophageal reflux disease)   . Arthritis   . Shortness of breath 01/21/2011  . Pure hypercholesterolemia 01/21/2011    Orientation RESPIRATION BLADDER Height & Weight     Self, Place  Normal Continent, Indwelling catheter Weight: 161 lb 2.5 oz (73.1 kg) Height:     BEHAVIORAL SYMPTOMS/MOOD NEUROLOGICAL BOWEL NUTRITION STATUS      Continent Diet(Please see DC Summary)  AMBULATORY STATUS COMMUNICATION OF NEEDS Skin   Extensive Assist Verbally Normal                       Personal Care Assistance Level of Assistance  Bathing, Feeding, Dressing Bathing Assistance: Maximum assistance Feeding assistance: Limited assistance Dressing Assistance: Maximum assistance     Functional Limitations Info  Sight, Hearing, Speech Sight Info: Impaired(Wears glasses) Hearing Info: Adequate Speech Info: Adequate    SPECIAL CARE FACTORS FREQUENCY  PT (By licensed PT), OT (By licensed OT)     PT Frequency: 5x/week OT Frequency: 3x/week            Contractures Contractures Info: Not present    Additional  Factors Info  Code Status, Allergies Code Status Info: Full Allergies Info: Diovan Valsartan, Prilosec Omeprazole           Current Medications (05/01/2018):  This is the current hospital active medication list Current Facility-Administered Medications  Medication Dose Route Frequency Provider Last Rate Last Dose  . 0.9 %  sodium chloride infusion  250 mL Intravenous PRN Tarry Kos A, MD      . albuterol (PROVENTIL) (2.5 MG/3ML) 0.083% nebulizer solution 3 mL  3 mL Inhalation Q6H PRN Haydee Monica, MD      . aspirin EC tablet 81 mg  81 mg Oral Daily Alekh, Kshitiz, MD   81 mg at 05/01/18 1512  . azithromycin (ZITHROMAX) 500 mg in sodium chloride 0.9 % 250 mL IVPB  500 mg Intravenous Q24H Tarry Kos A, MD      . cefTRIAXone (ROCEPHIN) 1 g in sodium chloride 0.9 % 100 mL IVPB  1 g Intravenous Q24H Tarry Kos A, MD 200 mL/hr at 05/01/18 1516 1 g at 05/01/18 1516  . doxazosin (CARDURA) tablet 4 mg  4 mg Oral QHS Alekh, Kshitiz, MD      . memantine [facility]) tablet 10 mg  10 mg Oral BID Glade Lloyd, MD   10 mg at 05/01/18 1512  . pramipexole (MIRAPEX) tablet 0.25 mg  0.25 mg Oral QHS Alekh, Kshitiz, MD      . rivastigmine (EXELON) capsule 3 mg  3 mg Oral BID Glade Lloyd, MD  3 mg at 05/01/18 1512  . sodium chloride flush (NS) 0.9 % injection 3 mL  3 mL Intravenous Q12H Tarry Kos A, MD   3 mL at 05/01/18 1043  . sodium chloride flush (NS) 0.9 % injection 3 mL  3 mL Intravenous PRN Haydee Monica, MD         Discharge Medications: Please see discharge summary for a list of discharge medications.  Relevant Imaging Results:  Relevant Lab Results:   Additional Information SSN: [id number]   Renne Crigler Gio Janoski, LCSW

## 2018-05-01 NOTE — Progress Notes (Addendum)
Patient ID: Louis Harris, male   DOB: 11-28-1934, 84 y.o.   MRN: [id number]  PROGRESS NOTE    Louis Harris  TDH:[id number] DOB: 15-Jun-1934 DOA: 04/30/2018 PCP: Anne Ng, NP   Brief Narrative:  84 year old male with history of Parkinson's disease presented on 04/30/2018 with confusion.  He was found to have pneumonia and started on IV antibiotics.  Assessment & Plan:   Principal Problem:   PNA (pneumonia) Active Problems:   Pure hypercholesterolemia   GERD (gastroesophageal reflux disease)   Acute metabolic encephalopathy  Community-acquired left lower lobar pneumonia -Currently on Rocephin and Zithromax.  Follow cultures.  Strep pneumo urinary antigen negative.  Currently on room air -Follow urinary Legionella antigen  Acute metabolic encephalopathy -Probably from above.  MRI of the brain was negative for acute stroke.  Monitor mental status.  Currently looks slightly confused.  PT/OT eval.  Fall precautions.  History of Parkinson's disease with probable dementia -Continue rivastigmine and Mirapex.  Continue Namenda.  Outpatient neurology follow-up.  Thrombocytopenia -Questionable cause.  No signs of bleeding.  Monitor   DVT prophylaxis: Start Lovenox Code Status: Full Family Communication: None at bedside Disposition Plan: Depends on clinical outcome and PT eval  Consultants: None  Procedures: None  Antimicrobials: Rocephin and Zithromax from 04/30/2018 onwards   Subjective: Patient seen and examined at bedside.  He is sleepy, wakes up on calling his name.  Very slow to respond to questions.  Looks slightly confused.  No overnight fever or vomiting.  Objective: Vitals:   04/30/18 2225 05/01/18 0504 05/01/18 0505 05/01/18 0605  BP:  (!) 112/59    Pulse:  (!) 44  (!) 56  Resp:  16    Temp:  99 F (37.2 C)    TempSrc:  Oral    SpO2:  99%    Weight: 71.3 kg  73.1 kg     Intake/Output Summary (Last 24 hours) at 05/01/2018 1059 Last data filed at  05/01/2018 0700 Gross per 24 hour  Intake 1349.81 ml  Output 350 ml  Net 999.81 ml   Filed Weights   04/30/18 2225 05/01/18 0505  Weight: 71.3 kg 73.1 kg    Examination:  General exam: Appears calm and comfortable.  No acute distress.  Sleepy, wakes up on calling his name, slow to respond to questions.  Slightly confused.  Has some upper extremity tremors. Respiratory system: Bilateral decreased breath sounds at bases, some scattered crackles Cardiovascular system: S1 & S2 heard, mildly bradycardic Gastrointestinal system: Abdomen is nondistended, soft and nontender. Normal bowel sounds heard. Extremities: No cyanosis, clubbing, edema  Central nervous system:  No focal neurological deficits. Moving extremities     Data Reviewed: I have personally reviewed following labs and imaging studies  CBC: Recent Labs  Lab 04/30/18 1432 05/01/18 0306  WBC 5.2 5.3  NEUTROABS 4.4 3.3  HGB 13.9 12.3*  HCT 43.1 38.7*  MCV 95.6 94.9  PLT 136* 117*   Basic Metabolic Panel: Recent Labs  Lab 04/30/18 1432 05/01/18 0306  NA 140 141  K 3.9 4.0  CL 107 108  CO2 23 24  GLUCOSE 99 84  BUN 11 9  CREATININE 0.84 0.82  CALCIUM 9.4 8.9   GFR: CrCl cannot be calculated (Unknown ideal weight.). Liver Function Tests: Recent Labs  Lab 04/30/18 1432  AST 19  ALT 17  ALKPHOS 60  BILITOT 0.9  PROT 7.0  ALBUMIN 4.1   No results for input(s): LIPASE, AMYLASE in the last 168 hours. No results for  input(s): AMMONIA in the last 168 hours. Coagulation Profile: No results for input(s): INR, PROTIME in the last 168 hours. Cardiac Enzymes: No results for input(s): CKTOTAL, CKMB, CKMBINDEX, TROPONINI in the last 168 hours. BNP (last 3 results) No results for input(s): PROBNP in the last 8760 hours. HbA1C: No results for input(s): HGBA1C in the last 72 hours. CBG: Recent Labs  Lab 04/30/18 1234 04/30/18 1436  GLUCAP 101* 98   Lipid Profile: No results for input(s): CHOL, HDL,  LDLCALC, TRIG, CHOLHDL, LDLDIRECT in the last 72 hours. Thyroid Function Tests: No results for input(s): TSH, T4TOTAL, FREET4, T3FREE, THYROIDAB in the last 72 hours. Anemia Panel: No results for input(s): VITAMINB12, FOLATE, FERRITIN, TIBC, IRON, RETICCTPCT in the last 72 hours. Sepsis Labs: Recent Labs  Lab 04/30/18 1432  LATICACIDVEN 1.3    Recent Results (from the past 240 hour(s))  Culture, blood (routine x 2)     Status: None (Preliminary result)   Collection Time: 04/30/18  3:00 PM  Result Value Ref Range Status   Specimen Description BLOOD RIGHT FOREARM  Final   Special Requests   Final    BOTTLES DRAWN AEROBIC ONLY Blood Culture results may not be optimal due to an inadequate volume of blood received in culture bottles   Culture   Final    NO GROWTH < 24 HOURS Performed at [facility], [address] [place], [place] [address]    Report Status PENDING  Incomplete  Culture, blood (routine x 2)     Status: None (Preliminary result)   Collection Time: 04/30/18  3:36 PM  Result Value Ref Range Status   Specimen Description BLOOD RIGHT ANTECUBITAL  Final   Special Requests   Final    BOTTLES DRAWN AEROBIC AND ANAEROBIC Blood Culture results may not be optimal due to an inadequate volume of blood received in culture bottles   Culture   Final    NO GROWTH < 24 HOURS Performed at [facility], [address] [place], [place] [address]    Report Status PENDING  Incomplete  MRSA PCR Screening     Status: None   Collection Time: 04/30/18 10:54 PM  Result Value Ref Range Status   MRSA by PCR NEGATIVE NEGATIVE Final    Comment:        The GeneXpert MRSA Assay (FDA approved for NASAL specimens only), is one component of a comprehensive MRSA colonization surveillance program. It is not intended to diagnose MRSA infection nor to guide or monitor treatment for MRSA infections. Performed at [facility], [address] [place], [place] [address]           Radiology Studies: Dg Chest 2 View  Result Date: 04/30/2018 CLINICAL DATA:  Altered level of consciousness today. EXAM: CHEST - 2 VIEW COMPARISON:  02/28/2016 FINDINGS: Artifact overlies the chest. Mild cardiomegaly. Chronic aortic atherosclerosis. Abnormal density in the left lower lobe that could represent developing pneumonia. The remainder the chest is clear. No measurable effusion. No acute bone finding. IMPRESSION: Suspicion of developing left lower lobe pneumonia. Electronically Signed   By: Paulina FusiMark  Shogry M.D.   On: 04/30/2018 13:11   Ct Head Wo Contrast  Result Date: 04/30/2018 CLINICAL DATA:  84 year old male with altered level of consciousness. Initial encounter. EXAM: CT HEAD WITHOUT CONTRAST TECHNIQUE: Contiguous axial images were obtained from the base of the skull through the vertex without intravenous contrast. COMPARISON:  None. FINDINGS: Brain: Subtle hypodensity medial left temporal lobe (series 3,  image 16). Although it is possible this represents partial volume averaging with adjacent cerebral spinal fluid, infarct or result of infection such as herpes can not be excluded. MR recommended for further delineation. No intracranial hemorrhage. Small area of encephalomalacia right frontal lobe unchanged may be related to remote infarct. Chronic microvascular changes. Global atrophy. No intracranial mass lesion noted on this unenhanced exam. Vascular: Vascular calcifications.  No acute hyperdense vessel. Skull: No acute calvarial abnormality. Sinuses/Orbits: No acute orbital abnormality. Post lens replacement. Minimal mucosal thickening inferior right maxillary sinus. Mastoid air cells and middle ear cavities are clear. Other: Rotation of the head and C1 upon C2. IMPRESSION: 1. Subtle hypodensity medial left temporal lobe (series 3, image 16). Although it is possible this represents partial volume averaging with adjacent cerebral spinal fluid, infarct or result of infection such as herpes  cannot be excluded. MR recommended for further delineation. 2. No intracranial hemorrhage. 3. Small area of encephalomalacia right frontal lobe unchanged may be related to remote infarct. 4. Chronic microvascular changes. 5. Global atrophy. 6. Rotation of the head and C1 upon C2. These results were called by telephone at the time of interpretation on 04/30/2018 at 1:33 pm to Dr. Jacalyn Lefevre , who verbally acknowledged these results. Electronically Signed   By: Lacy Duverney M.D.   On: 04/30/2018 13:46   Mr Laqueta Jean And Wo Contrast  Result Date: 04/30/2018 CLINICAL DATA:  Altered level of consciousness. EXAM: MRI HEAD WITHOUT AND WITH CONTRAST TECHNIQUE: Multiplanar, multiecho pulse sequences of the brain and surrounding structures were obtained without and with intravenous contrast. CONTRAST:  8 cc Gadavist COMPARISON:  Head CT same day.  MRI 04/12/2018 FINDINGS: Brain: Diffusion imaging does not show any acute or subacute infarction. The brainstem is normal. There are a few old small vessel cerebellar infarctions. Cerebral hemispheres show mild chronic small-vessel ischemic change of the deep white matter. There is an old small right frontal cortical and subcortical infarction. No evidence of mass lesion, hemorrhage, hydrocephalus or extra-axial collection. No mesial temporal abnormality as was questioned by CT. After contrast administration, no abnormal enhancement occurs. Vascular: Major vessels at the base of the brain show flow. Skull and upper cervical spine: Negative Sinuses/Orbits: Clear/normal Other: None IMPRESSION: No acute finding. Specifically, no evidence of left mesial temporal abnormality as was questioned by CT. Mild chronic small-vessel ischemic change. Age related atrophy. 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 Electronically Signed   By: Paulina Fusi M.D.   On: 04/30/2018 17:09        Scheduled Meds: . sodium chloride flush  3 mL Intravenous Q12H   Continuous  Infusions: . sodium chloride    . azithromycin    . cefTRIAXone (ROCEPHIN)  IV       LOS: 0 days        Glade Lloyd, MD [facility] 05/01/2018, 10:59 AM

## 2018-05-02 LAB — CBC WITH DIFFERENTIAL/PLATELET
Abs Immature Granulocytes: 0.01 10*3/uL (ref 0.00–0.07)
BASOS PCT: 0 %
Basophils Absolute: 0 10*3/uL (ref 0.0–0.1)
Eosinophils Absolute: 0 10*3/uL (ref 0.0–0.5)
Eosinophils Relative: 0 %
HCT: 39.4 % (ref 39.0–52.0)
Hemoglobin: 13.3 g/dL (ref 13.0–17.0)
Immature Granulocytes: 0 %
Lymphocytes Relative: 8 %
Lymphs Abs: 0.5 10*3/uL — ABNORMAL LOW (ref 0.7–4.0)
MCH: 31.6 pg (ref 26.0–34.0)
MCHC: 33.8 g/dL (ref 30.0–36.0)
MCV: 93.6 fL (ref 80.0–100.0)
MONO ABS: 0.4 10*3/uL (ref 0.1–1.0)
MONOS PCT: 7 %
Neutro Abs: 5.5 10*3/uL (ref 1.7–7.7)
Neutrophils Relative %: 85 %
Platelets: 111 10*3/uL — ABNORMAL LOW (ref 150–400)
RBC: 4.21 MIL/uL — ABNORMAL LOW (ref 4.22–5.81)
RDW: 13.7 % (ref 11.5–15.5)
WBC: 6.4 10*3/uL (ref 4.0–10.5)
nRBC: 0 % (ref 0.0–0.2)

## 2018-05-02 LAB — BASIC METABOLIC PANEL
Anion gap: 8 (ref 5–15)
BUN: 11 mg/dL (ref 8–23)
CO2: 25 mmol/L (ref 22–32)
Calcium: 8.9 mg/dL (ref 8.9–10.3)
Chloride: 105 mmol/L (ref 98–111)
Creatinine, Ser: 0.87 mg/dL (ref 0.61–1.24)
GFR calc Af Amer: >60 mL/min (ref >60)
GLUCOSE: 106 mg/dL — AB (ref 70–99)
Potassium: 3.7 mmol/L (ref 3.5–5.1)
Sodium: 138 mmol/L (ref 135–145)

## 2018-05-02 LAB — MAGNESIUM: Magnesium: 1.6 mg/dL — ABNORMAL LOW (ref 1.7–2.4)

## 2018-05-02 LAB — LEGIONELLA PNEUMOPHILA SEROGP 1 UR AG: L. pneumophila Serogp 1 Ur Ag: NEGATIVE

## 2018-05-02 NOTE — Evaluation (Signed)
Occupational Therapy Evaluation Patient Details Name: Louis Harris MRN: [id number] DOB: 05-Aug-1934 Today's Date: 05/02/2018    History of Present Illness Patient is 84 y/o male presenting to hospital with confusion secondary to pneumonia and acute metabolic encephalopathy. MRI was negative for any acute abnormality. PMH includes GERD, Parkinson's, probable dementia per notes, and anxiety.   Clinical Impression   Pt admitted for above, and limited by problem list below including impaired balance, cognition, and generalized weakness.  Pt able to follow some simple 1 step commands, able to open eyes sitting edge of bed to commands approx 50% of the time, but lethargic throughout session and limited engagement.  Pt unable to report PLOF and home setup, retrieve in note from PT evaluation. Requires max-total assist for bed mobility, total assist for all self care at this time.  Bed level hygiene after incontinent BM with total assist +2.  RN present during session and aware of lethargy. Pt will benefit from continued OT services while admitted and after dc at SNF level in order to maximize independence with ADLs and return to PLOF.     Follow Up Recommendations  SNF;Supervision/Assistance - 24 hour    Equipment Recommendations  Other (comment)(TBD at next venue of care)    Recommendations for Other Services       Precautions / Restrictions Precautions Precautions: Fall Restrictions Weight Bearing Restrictions: No      Mobility Bed Mobility Overal bed mobility: Needs Assistance Bed Mobility: Rolling;Sidelying to Sit;Sit to Sidelying Rolling: Max assist   Supine to sit: Total assist Sit to supine: Max assist   General bed mobility comments: pt supine in bed, assisted to EOB to increase alertness given total assist and back to supine with max assist; pt able to assist slightly with rolling by grasping to rail with multimodal cueing   Transfers                 General transfer  comment: deferred due to lethargy     Balance Overall balance assessment: Needs assistance Sitting-balance support: Single extremity supported;Feet supported Sitting balance-Leahy Scale: Poor Sitting balance - Comments: required at least R UE to maintain balance at EOB, R lateral lean if not supported on UE  Postural control: Right lateral lean                                 ADL either performed or assessed with clinical judgement   ADL Overall ADL's : Needs assistance/impaired     Grooming: Wash/dry hands;Wash/dry face;Total assistance;Bed level Grooming Details (indicate cue type and reason): attempted hand over hand support, but overall requires total assist  Upper Body Bathing: Total assistance;Sitting   Lower Body Bathing: Total assistance;+2 for physical assistance;+2 for safety/equipment;Bed level   Upper Body Dressing : Total assistance;Bed level   Lower Body Dressing: Total assistance;+2 for physical assistance;+2 for safety/equipment;Bed level     Toilet Transfer Details (indicate cue type and reason): deferred  Toileting- Clothing Manipulation and Hygiene: Total assistance;+2 for physical assistance;+2 for safety/equipment;Bed level Toileting - Clothing Manipulation Details (indicate cue type and reason): incontient BM, requires +2 for hygiene at bed level     Functional mobility during ADLs: Total assistance;+2 for physical assistance;+2 for safety/equipment General ADL Comments: limited by lethargy and participation, imparied cognition      Vision   Additional Comments: difficult to assess, maintains eyes closed for majoirty of session      Perception  Praxis      Pertinent Vitals/Pain Pain Assessment: Faces Faces Pain Scale: No hurt     Hand Dominance     Extremity/Trunk Assessment Upper Extremity Assessment Upper Extremity Assessment: Generalized weakness;Difficult to assess due to impaired cognition   Lower Extremity  Assessment Lower Extremity Assessment: Defer to PT evaluation       Communication Communication Communication: (limited verbalizations during sesssion )   Cognition Arousal/Alertness: Lethargic Behavior During Therapy: Flat affect Overall Cognitive Status: No family/caregiver present to determine baseline cognitive functioning                                 General Comments: Pt only oriented to person.  Limited verbalizations during session other than "yes ma'am", opens eyes to commands about 50% of the time; limited throughout session by lethargy.    General Comments       Exercises     Shoulder Instructions      Home Living Family/patient expects to be discharged to:: Private residence Living Arrangements: Alone Available Help at Discharge: Family;Available PRN/intermittently Type of Home: Independent living facility Home Access: Level entry     Home Layout: One level     Bathroom Shower/Tub: Producer, television/film/video: Handicapped height     Home Equipment: Environmental consultant - 2 wheels;Cane - single point   Additional Comments: from PT note, pt with limited verbalizations during OT sesson       Prior Functioning/Environment Level of Independence: Independent with assistive device(s)        Comments: from PT- reports occasionally using cane or RW for mobility. States he was independent with ADLs. Unsure of accuracy of PLOF secondary to cognitive deficits and no family or caregiver present        OT Problem List: Decreased strength;Impaired balance (sitting and/or standing);Decreased activity tolerance;Decreased coordination;Decreased cognition;Decreased safety awareness;Decreased knowledge of use of DME or AE      OT Treatment/Interventions: Self-care/ADL training;Therapeutic exercise;Balance training;Cognitive remediation/compensation;Therapeutic activities;Patient/family education;DME and/or AE instruction    OT Goals(Current goals can be found  in the care plan section) Acute Rehab OT Goals Patient Stated Goal: none stated  OT Goal Formulation: Patient unable to participate in goal setting Time For Goal Achievement: 05/16/18 Potential to Achieve Goals: Good  OT Frequency: Min 2X/week   Barriers to D/C:            Co-evaluation              AM-PAC OT "6 Clicks" Daily Activity     Outcome Measure Help from another person eating meals?: Total Help from another person taking care of personal grooming?: Total Help from another person toileting, which includes using toliet, bedpan, or urinal?: Total Help from another person bathing (including washing, rinsing, drying)?: Total Help from another person to put on and taking off regular upper body clothing?: Total Help from another person to put on and taking off regular lower body clothing?: Total 6 Click Score: 6   End of Session Nurse Communication: Mobility status;Precautions;Other (comment)(lethargy)  Activity Tolerance: Patient limited by lethargy Patient left: in bed;with call bell/phone within reach;with bed alarm set;with nursing/sitter in room  OT Visit Diagnosis: Other abnormalities of gait and mobility (R26.89);Other symptoms and signs involving cognitive function                Time: 1157-2620 OT Time Calculation (min): 22 min Charges:  OT General Charges $OT Visit: 1 Visit OT  Evaluation $OT Eval Moderate Complexity: 1 Mod  Chancy Milroy, [place] [facility] Pager [phone] Office [phone]    Chancy Milroy 05/02/2018, 8:26 AM
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
## 2018-05-02 NOTE — TOC Initial Note (Signed)
Transition of Care [facility]) - Initial/Assessment Note    Patient Details  Name: Louis Harris MRN: [id number] Date of Birth: 12/27/1934  Transition [facility]) CM/SW Contact:    Mearl Latin, LCSW Phone Number: 05/02/2018, 10:00 AM  Clinical Narrative:                 CSW received consult for possible SNF placement at time of discharge. CSW spoke with patient's cousin/POA regarding PT recommendation of SNF placement at time of discharge. She reported that patient has moved into [place] and has access to physical therapy and nurse aides. She also states that she is hiring someone to give him his medications since he has been having trouble with them lately. She requests that patient return there at discharge since the rehab facilities are not allowing visitors and she does not think he would do well there. She will let CSW know if patient requires home health services outside of what the facility offers. She does request a bedside commode. CSW to continue to follow and assist with discharge planning needs.   Expected Discharge Plan: Home w Home Health Services Barriers to Discharge: Continued Medical Work up   Patient Goals and CMS Choice Patient states their goals for this hospitalization and ongoing recovery are:: to get better and go home CMS Medicare.gov Compare Post Acute Care list provided to:: Other (Comment Required)(POA) Choice offered to / list presented to : Patient  Expected Discharge Plan and Services Expected Discharge Plan: Home w Home Health Services Discharge Planning Services: CM Consult Post Acute Care Choice: Durable Medical Equipment Living arrangements for the past 2 months: Independent Living Facility                 DME Arranged: 3-N-1 DME Agency: [employer]      Prior Living Arrangements/Services Living arrangements for the past 2 months: Independent Living Facility Lives with:: Self Patient language and need for interpreter  reviewed:: Yes Do you feel safe going back to the place where you live?: Yes      Need for Family Participation in Patient Care: Yes (Comment) Care giver support system in place?: Yes (comment) Current home services: DME, [facility] Criminal Activity/Legal Involvement Pertinent to Current Situation/Hospitalization: No - Comment as needed  Activities of Daily Living Home Assistive Devices/Equipment: Environmental consultant (specify type) ADL Screening (condition at time of admission) Patient's cognitive ability adequate to safely complete daily activities?: No Is the patient deaf or have difficulty hearing?: No Does the patient have difficulty seeing, even when wearing glasses/contacts?: No Does the patient have difficulty concentrating, remembering, or making decisions?: Yes Patient able to express need for assistance with ADLs?: Yes Does the patient have difficulty dressing or bathing?: Yes Independently performs ADLs?: No Communication: Independent Dressing (OT): Needs assistance Is this a change from baseline?: Pre-admission baseline Grooming: Needs assistance Is this a change from baseline?: Pre-admission baseline Feeding: Needs assistance Is this a change from baseline?: Pre-admission baseline Bathing: Needs assistance Is this a change from baseline?: Pre-admission baseline Toileting: Needs assistance Is this a change from baseline?: Pre-admission baseline In/Out Bed: Needs assistance Is this a change from baseline?: Pre-admission baseline Walks in Home: Needs assistance Is this a change from baseline?: Pre-admission baseline Weakness of Legs: Both Weakness of Arms/Hands: Both  Permission Sought/Granted Permission sought to share information with : Family Supports Permission granted to share information with : Yes, Verbal Permission Granted  Share Information with NAME: Althea Williams/ cousin/HCPOA([phone]) Deborah Phillips/ caregiver([phone])  Emotional  Assessment Appearance:: Appears stated age Attitude/Demeanor/Rapport: Unable to Assess Affect (typically observed): Unable to Assess Orientation: : Oriented to Self, Oriented to Place Alcohol / Substance Use: Not Applicable Psych Involvement: No (comment)  Admission diagnosis:  Altered mental status, unspecified altered mental status type [R41.82] Community acquired pneumonia of left lower lobe of lung (HCC) [J18.1] Patient Active Problem List   Diagnosis Date Noted  . Pneumonia 05/01/2018  . PNA (pneumonia) 04/30/2018  . Acute metabolic encephalopathy 04/30/2018  . Anxiety   . Depression   . GERD (gastroesophageal reflux disease)   . Arthritis   . Shortness of breath 01/21/2011  . Pure hypercholesterolemia 01/21/2011   PCP:  Renford Dills, MD Pharmacy:   [facility] [address],  - [address] [place] [address] Phone: [phone] Fax: [phone]  [facility] SERVICE - [place], [place] - [address] [facility] [address] [place] [address] [place] [address] Phone: [phone] Fax: [phone]     Social Determinants of Health (SDOH) Interventions    Readmission Risk Interventions 30 Day Unplanned Readmission Risk Score     ED to Hosp-Admission (Current) from 04/30/2018 in [place] [facility]  30 Day Unplanned Readmission Risk Score (%)  8 Filed at 05/02/2018 0801     This score is the patient's risk of an unplanned readmission within 30 days of being discharged (0 -100%). The score is based on dignosis, age, lab data, medications, orders, and past utilization.   Low:  0-14.9   Medium: 15-21.9   High: 22-29.9   Extreme: 30 and above       Readmission Risk Prevention Plan 05/02/2018  Post Dischage Appt Complete  Medication Screening Complete  Transportation Screening Complete  Some recent data might be hidden

## 2018-05-03 ENCOUNTER — Inpatient Hospital Stay (HOSPITAL_COMMUNITY): Payer: Medicare Other

## 2018-05-03 MED ORDER — AMOXICILLIN-POT CLAVULANATE 875-125 MG PO TABS: 1.0000 | ORAL_TABLET | Freq: Two times a day (BID) | ORAL | 0 refills | Status: AC

## 2018-05-03 MED ORDER — AMOXICILLIN-POT CLAVULANATE 875-125 MG PO TABS
1.0000 | ORAL_TABLET | Freq: Two times a day (BID) | ORAL | Status: DC
  Administered 2018-05-03 – 2018-05-04 (×2): 1 via ORAL
  Filled 2018-05-03 (×2): qty 1

## 2018-05-03 NOTE — Progress Notes (Signed)
Pt Foley catheter removed by MD orders, pt place in the chair and instructed to use the urinal. Will continue to monitor pt, for voiding trail prior to discharged.

## 2018-05-03 NOTE — TOC Progression Note (Signed)
Transition of Care [facility]) - Progression Note    Patient Details  Name: Louis Harris MRN: [id number] Date of Birth: May 15, 1934  Transition [facility] Care [facility]) CM/SW Contact  Mearl Latin, LCSW Phone Number: 05/03/2018, 3:40 PM  Clinical Narrative:    Patient's POA has now decided on SNF. [facility] has started Therapist, occupational. Pasrr pending.    Expected Discharge Plan: Home w Home Health Services Barriers to Discharge: Continued Medical Work up  Expected Discharge Plan and Services Expected Discharge Plan: Home w Home Health Services   Discharge Planning Services: CM Consult Post Acute Care Choice: Durable Medical Equipment Living arrangements for the past 2 months: Independent Living Facility Expected Discharge Date: 05/03/18               DME Arranged: 3-N-1 DME Agency: [facility]       Social Determinants of Health (SDOH) Interventions    Readmission Risk Interventions Readmission Risk Prevention Plan 05/02/2018  Post Dischage Appt Complete  Medication Screening Complete  Transportation Screening Complete  Some recent data might be hidden
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
## 2018-05-03 NOTE — Plan of Care (Signed)
  Problem: Clinical Measurements: Goal: Respiratory complications will improve Outcome: Progressing   Problem: Clinical Measurements: Goal: Ability to maintain clinical measurements within normal limits will improve Outcome: Progressing   

## 2018-05-03 NOTE — Evaluation (Signed)
Clinical/Bedside Swallow Evaluation Patient Details  Name: Louis Harris MRN: [id number] Date of Birth: 07-22-1934  Today's Date: 05/03/2018 Time: SLP Start Time (ACUTE ONLY): 0900 SLP Stop Time (ACUTE ONLY): 0917 SLP Time Calculation (min) (ACUTE ONLY): 17 min  Past Medical History:  Past Medical History:  Diagnosis Date  . Anxiety   . Arthritis   . Depression   . GERD (gastroesophageal reflux disease)   . Parkinson disease (HCC)   . Shortness of breath    exertion   Past Surgical History:  Past Surgical History:  Procedure Laterality Date  . CATARACT EXTRACTION W/PHACO Left 12/12/2012   Procedure: CATARACT EXTRACTION PHACO AND INTRAOCULAR LENS PLACEMENT (IOC);  Surgeon: Shade Flood, MD;  Location: [facility] OR;  Service: Ophthalmology;  Laterality: Left;  . CERVICAL SPINE SURGERY    . LEFT HEART CATHETERIZATION WITH CORONARY ANGIOGRAM N/A 01/21/2011   Procedure: LEFT HEART CATHETERIZATION WITH CORONARY ANGIOGRAM;  Surgeon: Donato Schultz, MD;  Location: [facility] CATH LAB;  Service: Cardiovascular;  Laterality: N/A;   HPI:  Pt is a 84 y.o. male with medical history significant of Parkinson's lives in assisted living comes in with confusion what his baseline is normal.  He denies any fevers or chills that he is aware of.  He has been feeling more weak than usual.  Initially was concerned that he had a stroke.  He denies any cough or lower extremity edema or swelling.  Patient found to have pneumonia referred for admission for such. MRI of the brain was negative for acute findings and chest x-ray suggested left lower lobe pneumonia.    Assessment / Plan / Recommendation Clinical Impression  Pt was seen for bedside swallow evaluation. He was alert throughout the session but his responses were often unrelated to the questions and his reliability as a historian is therefore questioned. Oral mechanism exam revealed mild lingual weakness but was otherwise within functional limits. Pt presents with symptoms  of oropharyngeal dysphagia characterized by multiple swallows with puree, suggesting possible pharyngeal residue, and throat clearing as well as coughing with thin liquids, suggesting possible aspiration. A modified barium swallow study is recommended to further assess swallow function and it is currently scheduled for 1000.  SLP Visit Diagnosis: Dysphagia, pharyngeal phase (R13.13)    Aspiration Risk       Diet Recommendation Other (Comment)(Deferred until MBS is completed)   Medication Administration: Crushed with puree    Other  Recommendations Oral Care Recommendations: Oral care BID;Staff/trained caregiver to provide oral care   Follow up Recommendations TBD based on results of MBS      Frequency and Duration min 2x/week  2 weeks       Prognosis Prognosis for Safe Diet Advancement: Fair Barriers to Reach Goals: Cognitive deficits      Swallow Study   General Date of Onset: 05/02/18 HPI: Pt is a 84 y.o. male with medical history significant of Parkinson's lives in assisted living comes in with confusion what his baseline is normal.  He denies any fevers or chills that he is aware of.  He has been feeling more weak than usual.  Initially was concerned that he had a stroke.  He denies any cough or lower extremity edema or swelling.  Patient found to have pneumonia referred for admission for such. MRI of the brain was negative for acute findings and chest x-ray suggested left lower lobe pneumonia.  Type of Study: Bedside Swallow Evaluation Previous Swallow Assessment: None Diet Prior to this Study: Regular;Thin liquids Temperature Spikes Noted:  No Respiratory Status: Room air History of Recent Intubation: No Behavior/Cognition: Alert;Cooperative;Pleasant mood;Doesn't follow directions(Difficulty following commands) Oral Cavity Assessment: Within Functional Limits Oral Care Completed by SLP: Recent completion by staff Oral Cavity - Dentition: Adequate natural dentition Vision:  Functional for self-feeding Self-Feeding Abilities: Needs assist Patient Positioning: Upright in bed;Postural control adequate for testing Baseline Vocal Quality: Low vocal intensity Volitional Cough: Weak Volitional Swallow: Able to elicit    Oral/Motor/Sensory Function Overall Oral Motor/Sensory Function: Mild impairment Facial ROM: Within Functional Limits Facial Symmetry: Within Functional Limits Facial Strength: Within Functional Limits Facial Sensation: Within Functional Limits Lingual ROM: Within Functional Limits Lingual Symmetry: Within Functional Limits Lingual Strength: Reduced;Suspected CN XII (hypoglossal) dysfunction Lingual Sensation: Other (Comment)(Pt inadequately reliable with responses. ) Velum: Within Functional Limits Mandible: Within Functional Limits   Ice Chips Ice chips: Within functional limits   Thin Liquid Thin Liquid: Impaired Presentation: Cup;Straw;Spoon Pharyngeal  Phase Impairments: Throat Clearing - Immediate;Cough - Delayed(Throat clear with thin via cup and tsp; cough via straw)    Nectar Thick Nectar Thick Liquid: Not tested   Honey Thick Honey Thick Liquid: Not tested   Puree Puree: Impaired Presentation: Spoon Pharyngeal Phase Impairments: Multiple swallows   Solid     Solid: Within functional limits     Carmeron Heady I. Vear Clock, MS, CCC-SLP Acute Rehabilitation Services Office number [phone] Pager [phone]  Scheryl Marten 05/03/2018,9:22 AM

## 2018-05-03 NOTE — Progress Notes (Signed)
Modified Barium Swallow Progress Note  Patient Details  Name: Louis Harris MRN: [id number] Date of Birth: December 23, 1934  Today's Date: 05/03/2018  Modified Barium Swallow completed.  Full report located under Chart Review in the Imaging Section.  Brief recommendations include the following:  Clinical Impression  Pt presents with mild-moderate oropharyngeal dysphagia characterized by impaired bolus contol, reduced lingual retraction, a pharyngeal delay, reduced hyolaryngeal elevation, reduced laryngeal excursion, inconsistently incomplete epiglottic retroversion, and reduced pharyngeal constriction. He demonstrated premature spillage to the pyriform sinuses, vallecular residue, pyriform sinus residue, and posterior pharyngeal wall residue. Penetration and aspiration (PAS 3, 7) were noted with thin liquids via cup and straw secondary to the pharyngeal delay combined with incomplete epiglottic retroversion. A chin tuck posture was attempted to reduce premature spillage and eliminate penetration and aspiration but pt was unable to implement this strategy. A regular texture diet with nectar thick liquids is recommended at this time. SLP will follow for dysphagia treatment to ensure tolerance of the recommended diet.    Swallow Evaluation Recommendations       SLP Diet Recommendations: Regular solids;Nectar thick liquid   Liquid Administration via: Cup;Straw   Medication Administration: Whole meds with puree(Whole or crushed with apple sauce)   Supervision: Staff to assist with self feeding   Compensations: Slow rate;Small sips/bites;Follow solids with liquid   Postural Changes: Seated upright at 90 degrees   Oral Care Recommendations: Oral care BID;Staff/trained caregiver to provide oral care       [name] I. Vear Clock, MS, CCC-SLP Acute Rehabilitation Services Office number [phone] Pager [phone]  Scheryl Marten 05/03/2018,10:35 AM

## 2018-05-03 NOTE — NC FL2 (Signed)
[place] MEDICAID FL2 LEVEL OF CARE SCREENING TOOL     IDENTIFICATION  Patient Name: Louis Harris Birthdate: December 14, 1934 Sex: male Admission Date (Current Location): 04/30/2018  [facility] and [place] Number:  Producer, television/film/video and Address:  [facility] [place], [address] [place], [place] [address]      Provider Number: [id number]  Attending Physician Name and Address:  Maretta Bees, MD  Relative Name and Phone Number:  Vanita Ingles, [phone]    Current Level of Care: Hospital Recommended Level of Care: Skilled Nursing Facility Prior Approval Number:    Date Approved/Denied:   PASRR Number: [id number]  Discharge Plan: SNF    Current Diagnoses: Patient Active Problem List   Diagnosis Date Noted  . Pneumonia 05/01/2018  . PNA (pneumonia) 04/30/2018  . Acute metabolic encephalopathy 04/30/2018  . Anxiety   . Depression   . GERD (gastroesophageal reflux disease)   . Arthritis   . Shortness of breath 01/21/2011  . Pure hypercholesterolemia 01/21/2011    Orientation RESPIRATION BLADDER Height & Weight     Self, Place  Normal Continent, Indwelling catheter Weight: 156 lb 1.4 oz (70.8 kg) Height:  5\' 11"  (180.3 cm)  BEHAVIORAL SYMPTOMS/MOOD NEUROLOGICAL BOWEL NUTRITION STATUS      Continent Diet(Please see DC Summary)  AMBULATORY STATUS COMMUNICATION OF NEEDS Skin   Extensive Assist Verbally Normal                       Personal Care Assistance Level of Assistance  Bathing, Feeding, Dressing Bathing Assistance: Maximum assistance Feeding assistance: Limited assistance Dressing Assistance: Maximum assistance     Functional Limitations Info  Sight, Hearing, Speech Sight Info: Impaired(Wears glasses) Hearing Info: Adequate Speech Info: Adequate    SPECIAL CARE FACTORS FREQUENCY  PT (By licensed PT), OT (By licensed OT)     PT Frequency: 5x/week OT Frequency: 3x/week            Contractures Contractures Info: Not  present    Additional Factors Info  Code Status, Allergies Code Status Info: Full Allergies Info: Diovan Valsartan, Prilosec Omeprazole           Current Medications (05/03/2018):  This is the current hospital active medication list Current Facility-Administered Medications  Medication Dose Route Frequency Provider Last Rate Last Dose  . 0.9 %  sodium chloride infusion  250 mL Intravenous PRN Tarry Kos A, MD      . albuterol (PROVENTIL) (2.5 MG/3ML) 0.083% nebulizer solution 3 mL  3 mL Inhalation Q6H PRN Haydee Monica, MD      . amoxicillin-clavulanate (AUGMENTIN) 875-125 MG per tablet 1 tablet  1 tablet Oral Q12H Ghimire, Werner Lean, MD      . aspirin EC tablet 81 mg  81 mg Oral Daily Hanley Ben, Kshitiz, MD   81 mg at 05/03/18 1054  . docusate sodium (COLACE) capsule 100 mg  100 mg Oral BID Schorr, Roma Kayser, NP   100 mg at 05/03/18 1054  . doxazosin (CARDURA) tablet 4 mg  4 mg Oral QHS Hanley Ben, Kshitiz, MD   4 mg at 05/02/18 2250  . memantine (NAMENDA) tablet 10 mg  10 mg Oral BID Glade Lloyd, MD   10 mg at 05/03/18 1054  . pramipexole (MIRAPEX) tablet 0.25 mg  0.25 mg Oral QHS Alekh, Kshitiz, MD   0.25 mg at 05/02/18 2250  . rivastigmine (EXELON) capsule 3 mg  3 mg Oral BID Glade Lloyd, MD   3 mg  at 05/03/18 1055  . sodium chloride flush (NS) 0.9 % injection 3 mL  3 mL Intravenous Q12H Tarry Kos A, MD   3 mL at 05/03/18 1055  . sodium chloride flush (NS) 0.9 % injection 3 mL  3 mL Intravenous PRN Haydee Monica, MD         Discharge Medications: Please see discharge summary for a list of discharge medications.  Relevant Imaging Results:  Relevant Lab Results:   Additional Information SSN: [id number]   Renne Crigler Sky Borboa, LCSW

## 2018-05-03 NOTE — Progress Notes (Signed)
Pt  Pass voiding trial voiding 275 ml of urine, no bladder distension or complaints of voiding when asked.

## 2018-05-04 NOTE — Progress Notes (Signed)
Seen and examined Completely awake and alert-looking at the breakfast menu about to order breakfast.  Blood pressure 135/75, pulse (!) 55, temperature 98.5 F (36.9 C), resp. rate 18, height 5\' 11"  (1.803 m), weight 70.8 kg, SpO2 99 %.  Lungs: Clear to auscultation  Okay to discharge-see discharge summary for details.  Awaiting SNF

## 2018-05-04 NOTE — TOC Transition Note (Addendum)
Transition of Care [facility]) - CM/SW Discharge Note   Patient Details  Name: Louis Harris MRN: [id number] Date of Birth: 1934/04/04  Transition [facility] CM/SW Contact:  Mearl Latin, LCSW Phone Number: 05/04/2018, 9:01 AM   Clinical Narrative:    Patient will DC to: [facility] Anticipated DC date: 05/04/18 Family notified: POA, Althea Transport by: Sharin Mons   Per MD patient ready for DC to [facility]. RN, patient, patient's family, and facility notified of DC. Discharge Summary and FL2 sent to facility. RN to call report prior to discharge [phone] Room [phone]). DC packet on chart. Ambulance transport requested for patient.   CSW will sign off for now as social work intervention is no longer needed. Please consult [place] again if new needs arise.  Cristobal Goldmann, LCSW Clinical Social Worker [phone]     [facility] has insurance approval. Patient POA going there at 11am today to sign admission paperwork.   Final next level of care: Skilled Nursing Facility Barriers to Discharge: Insurance Authorization   Patient Goals and CMS Choice Patient states their goals for this hospitalization and ongoing recovery are:: to get better and go home CMS Medicare.gov Compare Post Acute Care list provided to:: Other (Comment Required)(POA) Choice offered to / list presented to : Patient  Discharge Placement              Patient chooses bed at: [facility]        Discharge Plan and Services   Discharge Planning Services: CM Consult Post Acute Care Choice: Durable Medical Equipment          DME Arranged: 3-N-1 DME Agency: [employer]       Social Determinants of Health (SDOH) Interventions     Readmission Risk Interventions Readmission Risk Prevention Plan 05/02/2018  Post Dischage Appt Complete  Medication Screening Complete  Transportation Screening Complete  Some recent data might be hidden

## 2018-05-04 NOTE — Care Management Important Message (Signed)
Important Message  Patient Details  Name: Louis Harris MRN: [id number] Date of Birth: [birth year]   Medicare Important Message Given:  Yes    Devyn Sheerin Stefan Church 05/04/2018, 3:48 PM

## 2018-05-04 NOTE — Progress Notes (Signed)
Louis Harris to be D/C'd to [facility] facility per MD order.  Discussed with the patient and all questions fully answered.  VSS, Skin clean, dry and intact without evidence of skin break down, no evidence of skin tears noted. IV catheter discontinued intact. Site without signs and symptoms of complications. Dressing and pressure applied.  An After Visit Summary was printed and given to the patient. Patient received prescription.  D/c education completed with patient/family including follow up instructions, medication list, d/c activities limitations if indicated, with other d/c instructions as indicated by MD - patient able to verbalize understanding, all questions fully answered.   Patient instructed to return to ED, call 911, or call MD for any changes in condition.   Patient escorted via [facility], to [facility] facility, Nurse Nia given report and has been informed.   Louis Harris A Leticia Clas Montejano  05/04/2018 1:10 PM

## 2018-05-05 LAB — CULTURE, BLOOD (ROUTINE X 2)
Culture: NO GROWTH
Culture: NO GROWTH

## 2018-11-29 ENCOUNTER — Ambulatory Visit
Admission: RE | Admit: 2018-11-29 | Discharge: 2018-11-29 | Disposition: A | Discharge status: Home / Self Care | Payer: Medicare Other | Source: Ambulatory Visit | Attending: Internal Medicine | Admitting: Internal Medicine

## 2018-11-29 ENCOUNTER — Other Ambulatory Visit: Payer: Self-pay | Admitting: Internal Medicine

## 2018-11-29 DIAGNOSIS — M545 Low back pain, unspecified: Secondary | ICD-10-CM

## 2019-02-05 ENCOUNTER — Other Ambulatory Visit: Payer: Self-pay | Admitting: Internal Medicine

## 2019-02-05 ENCOUNTER — Ambulatory Visit
Admission: RE | Admit: 2019-02-05 | Discharge: 2019-02-05 | Disposition: A | Discharge status: Home / Self Care | Payer: Medicare Other | Source: Ambulatory Visit | Attending: Internal Medicine | Admitting: Internal Medicine

## 2019-02-05 DIAGNOSIS — K5900 Constipation, unspecified: Secondary | ICD-10-CM

## 2019-04-10 ENCOUNTER — Encounter (HOSPITAL_COMMUNITY): Payer: Self-pay | Admitting: Emergency Medicine

## 2019-04-10 ENCOUNTER — Other Ambulatory Visit: Payer: Self-pay

## 2019-04-10 ENCOUNTER — Emergency Department (HOSPITAL_COMMUNITY): Payer: Medicare Other

## 2019-04-10 ENCOUNTER — Inpatient Hospital Stay (HOSPITAL_COMMUNITY)
Admission: EM | Admit: 2019-04-10 | Discharge: 2019-04-12 | DRG: 193 | Disposition: A | Discharge status: Home Health | Payer: Medicare Other | Attending: Student | Admitting: Student

## 2019-04-10 ENCOUNTER — Inpatient Hospital Stay (HOSPITAL_COMMUNITY): Payer: Medicare Other

## 2019-04-10 DIAGNOSIS — I452 Bifascicular block: Secondary | ICD-10-CM | POA: Diagnosis present

## 2019-04-10 DIAGNOSIS — Z7982 Long term (current) use of aspirin: Secondary | ICD-10-CM

## 2019-04-10 DIAGNOSIS — J189 Pneumonia, unspecified organism: Principal | ICD-10-CM | POA: Diagnosis present

## 2019-04-10 DIAGNOSIS — Z8249 Family history of ischemic heart disease and other diseases of the circulatory system: Secondary | ICD-10-CM | POA: Diagnosis not present

## 2019-04-10 DIAGNOSIS — F10231 Alcohol dependence with withdrawal delirium: Secondary | ICD-10-CM | POA: Diagnosis not present

## 2019-04-10 DIAGNOSIS — F0280 Dementia in other diseases classified elsewhere without behavioral disturbance: Secondary | ICD-10-CM | POA: Diagnosis present

## 2019-04-10 DIAGNOSIS — F419 Anxiety disorder, unspecified: Secondary | ICD-10-CM | POA: Diagnosis present

## 2019-04-10 DIAGNOSIS — Z888 Allergy status to other drugs, medicaments and biological substances status: Secondary | ICD-10-CM | POA: Diagnosis not present

## 2019-04-10 DIAGNOSIS — R5381 Other malaise: Secondary | ICD-10-CM | POA: Diagnosis not present

## 2019-04-10 DIAGNOSIS — R4182 Altered mental status, unspecified: Secondary | ICD-10-CM

## 2019-04-10 DIAGNOSIS — R1310 Dysphagia, unspecified: Secondary | ICD-10-CM | POA: Diagnosis not present

## 2019-04-10 DIAGNOSIS — Z791 Long term (current) use of non-steroidal anti-inflammatories (NSAID): Secondary | ICD-10-CM

## 2019-04-10 DIAGNOSIS — Z832 Family history of diseases of the blood and blood-forming organs and certain disorders involving the immune mechanism: Secondary | ICD-10-CM | POA: Diagnosis not present

## 2019-04-10 DIAGNOSIS — F329 Major depressive disorder, single episode, unspecified: Secondary | ICD-10-CM | POA: Diagnosis present

## 2019-04-10 DIAGNOSIS — K219 Gastro-esophageal reflux disease without esophagitis: Secondary | ICD-10-CM | POA: Diagnosis present

## 2019-04-10 DIAGNOSIS — Z79899 Other long term (current) drug therapy: Secondary | ICD-10-CM | POA: Diagnosis not present

## 2019-04-10 DIAGNOSIS — G9341 Metabolic encephalopathy: Secondary | ICD-10-CM | POA: Diagnosis present

## 2019-04-10 DIAGNOSIS — M1990 Unspecified osteoarthritis, unspecified site: Secondary | ICD-10-CM | POA: Diagnosis present

## 2019-04-10 DIAGNOSIS — G20 Parkinson's disease: Secondary | ICD-10-CM | POA: Diagnosis present

## 2019-04-10 DIAGNOSIS — R531 Weakness: Secondary | ICD-10-CM | POA: Diagnosis not present

## 2019-04-10 DIAGNOSIS — Z8616 Personal history of COVID-19: Secondary | ICD-10-CM

## 2019-04-10 DIAGNOSIS — G20A1 Parkinson's disease without dyskinesia, without mention of fluctuations: Secondary | ICD-10-CM

## 2019-04-10 DIAGNOSIS — R1311 Dysphagia, oral phase: Secondary | ICD-10-CM | POA: Diagnosis not present

## 2019-04-10 DIAGNOSIS — D696 Thrombocytopenia, unspecified: Secondary | ICD-10-CM | POA: Diagnosis present

## 2019-04-10 DIAGNOSIS — R9389 Abnormal findings on diagnostic imaging of other specified body structures: Secondary | ICD-10-CM | POA: Diagnosis not present

## 2019-04-10 DIAGNOSIS — G9340 Encephalopathy, unspecified: Secondary | ICD-10-CM

## 2019-04-10 DIAGNOSIS — F0390 Unspecified dementia without behavioral disturbance: Secondary | ICD-10-CM | POA: Diagnosis not present

## 2019-04-10 LAB — COMPREHENSIVE METABOLIC PANEL
ALT: 10 U/L (ref 0–44)
AST: 19 U/L (ref 15–41)
Albumin: 3.4 g/dL — ABNORMAL LOW (ref 3.5–5.0)
Alkaline Phosphatase: 58 U/L (ref 38–126)
Anion gap: 11 (ref 5–15)
BUN: 7 mg/dL — ABNORMAL LOW (ref 8–23)
CO2: 23 mmol/L (ref 22–32)
Calcium: 9 mg/dL (ref 8.9–10.3)
Chloride: 105 mmol/L (ref 98–111)
Creatinine, Ser: 0.97 mg/dL (ref 0.61–1.24)
GFR calc Af Amer: >60 mL/min (ref >60)
GFR calc non Af Amer: >60 mL/min (ref >60)
Glucose, Bld: 102 mg/dL — ABNORMAL HIGH (ref 70–99)
Potassium: 4.3 mmol/L (ref 3.5–5.1)
Sodium: 139 mmol/L (ref 135–145)
Total Bilirubin: 0.8 mg/dL (ref 0.3–1.2)
Total Protein: 6 g/dL — ABNORMAL LOW (ref 6.5–8.1)

## 2019-04-10 LAB — CBC WITH DIFFERENTIAL/PLATELET
Abs Immature Granulocytes: 0.01 10*3/uL (ref 0.00–0.07)
Basophils Absolute: 0 10*3/uL (ref 0.0–0.1)
Basophils Relative: 0 %
Eosinophils Absolute: 0 10*3/uL (ref 0.0–0.5)
Eosinophils Relative: 1 %
HCT: 43.1 % (ref 39.0–52.0)
Hemoglobin: 13.8 g/dL (ref 13.0–17.0)
Immature Granulocytes: 0 %
Lymphocytes Relative: 23 %
Lymphs Abs: 0.7 10*3/uL (ref 0.7–4.0)
MCH: 30.5 pg (ref 26.0–34.0)
MCHC: 32 g/dL (ref 30.0–36.0)
MCV: 95.4 fL (ref 80.0–100.0)
Monocytes Absolute: 0.2 10*3/uL (ref 0.1–1.0)
Monocytes Relative: 8 %
Neutro Abs: 1.9 10*3/uL (ref 1.7–7.7)
Neutrophils Relative %: 68 %
Platelets: DECREASED 10*3/uL (ref 150–400)
RBC: 4.52 MIL/uL (ref 4.22–5.81)
RDW: 14.6 % (ref 11.5–15.5)
WBC: 2.9 10*3/uL — ABNORMAL LOW (ref 4.0–10.5)
nRBC: 0 % (ref 0.0–0.2)

## 2019-04-10 LAB — URINALYSIS, ROUTINE W REFLEX MICROSCOPIC
Bilirubin Urine: NEGATIVE
Glucose, UA: NEGATIVE mg/dL
Hgb urine dipstick: NEGATIVE
Ketones, ur: NEGATIVE mg/dL
Leukocytes,Ua: NEGATIVE
Nitrite: NEGATIVE
Protein, ur: NEGATIVE mg/dL
Specific Gravity, Urine: 1.014 (ref 1.005–1.030)
pH: 7 (ref 5.0–8.0)

## 2019-04-10 LAB — CBG MONITORING, ED: Glucose-Capillary: 94 mg/dL (ref 70–99)

## 2019-04-10 LAB — AMMONIA: Ammonia: 23 umol/L (ref 9–35)

## 2019-04-10 MED ORDER — SODIUM CHLORIDE 0.9 % IV SOLN: INTRAVENOUS | Status: AC

## 2019-04-10 MED ORDER — ACETAMINOPHEN 650 MG RE SUPP: 650.0000 mg | Freq: Four times a day (QID) | RECTAL | Status: DC | PRN

## 2019-04-10 MED ORDER — ACETAMINOPHEN 325 MG PO TABS: 650.0000 mg | ORAL_TABLET | Freq: Four times a day (QID) | ORAL | Status: DC | PRN

## 2019-04-10 MED ORDER — SODIUM CHLORIDE 0.9 % IV SOLN
1.0000 g | Freq: Once | INTRAVENOUS | Status: AC
  Administered 2019-04-10: 1 g via INTRAVENOUS
  Filled 2019-04-10: qty 10

## 2019-04-10 MED ORDER — SODIUM CHLORIDE 0.9 % IV SOLN
500.0000 mg | INTRAVENOUS | Status: DC
  Filled 2019-04-10: qty 500

## 2019-04-10 MED ORDER — SODIUM CHLORIDE 0.9 % IV SOLN
500.0000 mg | Freq: Once | INTRAVENOUS | Status: AC
  Administered 2019-04-10: 500 mg via INTRAVENOUS
  Filled 2019-04-10: qty 500

## 2019-04-10 MED ORDER — SODIUM CHLORIDE 0.9 % IV SOLN: 1.0000 g | INTRAVENOUS | Status: DC

## 2019-04-10 NOTE — ED Notes (Signed)
Pt transported to MRI 

## 2019-04-10 NOTE — H&P (Signed)
History and Physical    Louis Harris TFT:[id number] DOB: [birth year] DOA: 04/10/2019  PCP: Seward Carol, MD Patient coming from: Assisted living facility  Chief Complaint: Altered mental status  HPI: Louis Harris is a 85 y.o. male with medical history significant of anxiety, depression, arthritis, GERD, Parkinson's disease, dementia presenting to the ED from his ALF for evaluation of altered mental status.  Patient is currently very somnolent.  No history could be obtained from him.  History provided by patient's cousin and healthcare power of attorney Elba Barman at bedside.  States patient started having symptoms of COVID-19 including cough and chest congestion on February 3.  He tested positive for COVID-19 on 03/22/2019.  He felt ill for approximately 10 days following his diagnosis and was quarantined at home.  His illness did not require any prescription medications or hospitalization.  The patient finally overcame his COVID-19 infection and had gone back to baseline.  He became more physically active and was performing his ADLs.  She states early this morning after waking up patient appeared confused.  He was lethargic and did not eat his breakfast.  He was confused and not following any commands.  This is very abnormal for the patient and even his physical therapist became concerned.  He is brought to the hospital to be evaluated.  ED Course: Afebrile and hemodynamically stable.  Labs suggestive of leukopenia (WBC count 2.9).  Platelet count could not be calculated due to clumps noted on smear.  Not hypoglycemic.  Ammonia level normal.  UA not suggestive of infection.  Blood culture x2 pending. Chest x-ray personally reviewed showing airspace opacity at the left lung base and probable small left-sided pleural effusion. Patient received ceftriaxone and azithromycin.  Review of Systems:  All systems reviewed and apart from history of presenting illness, are negative.  Past Medical History:   Diagnosis Date  . Anxiety   . Arthritis   . Depression   . GERD (gastroesophageal reflux disease)   . Parkinson disease ([place])   . Shortness of breath    exertion    Past Surgical History:  Procedure Laterality Date  . CATARACT EXTRACTION W/PHACO Left 12/12/2012   Procedure: CATARACT EXTRACTION PHACO AND INTRAOCULAR LENS PLACEMENT (IOC);  Surgeon: Adonis Brook, MD;  Location: [facility];  Service: Ophthalmology;  Laterality: Left;  . CERVICAL SPINE SURGERY    . LEFT HEART CATHETERIZATION WITH CORONARY ANGIOGRAM N/A 01/21/2011   Procedure: LEFT HEART CATHETERIZATION WITH CORONARY ANGIOGRAM;  Surgeon: Candee Furbish, MD;  Location: [facility] CATH LAB;  Service: Cardiovascular;  Laterality: N/A;     reports that he has never smoked. He has never used smokeless tobacco. He reports that he does not drink alcohol or use drugs.  Allergies  Allergen Reactions  . Diovan [Valsartan] Diarrhea  . Prilosec [Omeprazole] Diarrhea    Family History  Problem Relation Age of Onset  . Heart attack Father   . Lupus Sister     Prior to Admission medications   Medication Sig Start Date End Date Taking? Authorizing Provider  doxazosin (CARDURA) 4 MG tablet Take 4 mg by mouth daily.    Yes [provider]  gabapentin (NEURONTIN) 100 MG capsule Take 100 mg by mouth at bedtime.   Yes [provider]  gabapentin (NEURONTIN) 300 MG capsule Take 300 mg by mouth daily.   Yes [provider]  meloxicam (MOBIC) 15 MG tablet Take 15 mg by mouth daily.    Yes [provider]  albuterol (PROVENTIL HFA;VENTOLIN HFA)  108 (90 BASE) MCG/ACT inhaler Inhale 2 puffs into the lungs every 6 (six) hours as needed for wheezing.    [provider]  aspirin EC 81 MG tablet Take 81 mg by mouth daily.      [provider]  calcium citrate-vitamin D (CITRACAL+D) 315-200 MG-UNIT tablet Take 1 tablet by mouth daily.     [provider]  calcium gluconate 500 MG tablet Take 500 mg  by mouth daily.     [provider]  cycloSPORINE (RESTASIS) 0.05 % ophthalmic emulsion Place 1 drop into both eyes 2 (two) times daily.    [provider]  finasteride (PROSCAR) 5 MG tablet Take 5 mg by mouth daily.    [provider]  fish oil-omega-3 fatty acids 1000 MG capsule Take 1 g by mouth daily.      [provider]  fluticasone (FLONASE) 50 MCG/ACT nasal spray Place 1 spray into both nostrils daily as needed for allergies or rhinitis.    [provider]  ibuprofen (ADVIL,MOTRIN) 200 MG tablet Take 200-600 mg by mouth every 6 (six) hours as needed for pain.     [provider]  memantine (NAMENDA) 10 MG tablet Take 10 mg by mouth 2 (two) times daily.    [provider]  Multiple Vitamins-Minerals (MULTIVITAMINS THER. W/MINERALS) TABS Take 1 tablet by mouth daily.      [provider]  pantoprazole (PROTONIX) 40 MG tablet Take 40 mg by mouth daily.    [provider]  Polyethyl Glycol-Propyl Glycol (SYSTANE) 0.4-0.3 % GEL ophthalmic gel Place 1 application 2 (two) times daily as needed into both eyes (dryness).    [provider]  pramipexole (MIRAPEX) 0.125 MG tablet Take 0.25 mg by mouth at bedtime.     [provider]  rivastigmine (EXELON) 3 MG capsule Take 3 mg by mouth See admin instructions. Start date 02/28/18 3 mg for 2 weeks daily 3 mg BID for 2 weeks 3 mg daily and 6 mg in the evening at supper for 2 weeks 6 mg twice a day    [provider]    Physical Exam: Vitals:   04/10/19 1900 04/10/19 2026 04/10/19 2100 04/10/19 2102  BP: (!) 144/82  116/82 116/82  Pulse: 71  77 82  Resp: 16  17 17   Temp:  98.2 F (36.8 C)    TempSrc:  Oral    SpO2: 100%  97% 97%  Weight:      Height:        Physical Exam  Constitutional: He appears well-developed and well-nourished. No distress.  HENT:  Head: Normocephalic.  Dry mucous membranes  Eyes: Right eye exhibits no  discharge. Left eye exhibits no discharge.  Unable to assess pupillary reaction to light due to lack of patient cooperation  Cardiovascular: Normal rate, regular rhythm and intact distal pulses.  Pulmonary/Chest: Effort normal. He has no wheezes. He has no rales.  Decreased breath sounds at the left lung base  Abdominal: Soft. Bowel sounds are normal. He exhibits no distension. There is no abdominal tenderness. There is no guarding.  Musculoskeletal:        General: No edema.     Cervical back: Neck supple.  Neurological:  Disoriented Very somnolent and not following commands No obvious facial droop  Skin: Skin is warm and dry. He is not diaphoretic.     Labs on Admission: I have personally reviewed following labs and imaging studies  CBC: Recent Labs  Lab  04/10/19 1715  WBC 2.9*  NEUTROABS 1.9  HGB 13.8  HCT 43.1  MCV 95.4  PLT PLATELET CLUMPS NOTED ON SMEAR, COUNT APPEARS DECREASED   Basic Metabolic Panel: Recent Labs  Lab 04/10/19 1715  NA 139  K 4.3  CL 105  CO2 23  GLUCOSE 102*  BUN 7*  CREATININE 0.97  CALCIUM 9.0   GFR: Estimated Creatinine Clearance: 60.8 mL/min (by C-G formula based on SCr of 0.97 mg/dL). Liver Function Tests: Recent Labs  Lab 04/10/19 1715  AST 19  ALT 10  ALKPHOS 58  BILITOT 0.8  PROT 6.0*  ALBUMIN 3.4*   No results for input(s): LIPASE, AMYLASE in the last 168 hours. Recent Labs  Lab 04/10/19 1700  AMMONIA 23   Coagulation Profile: No results for input(s): INR, PROTIME in the last 168 hours. Cardiac Enzymes: No results for input(s): CKTOTAL, CKMB, CKMBINDEX, TROPONINI in the last 168 hours. BNP (last 3 results) No results for input(s): PROBNP in the last 8760 hours. HbA1C: No results for input(s): HGBA1C in the last 72 hours. CBG: Recent Labs  Lab 04/10/19 1717  GLUCAP 94   Lipid Profile: No results for input(s): CHOL, HDL, LDLCALC, TRIG, CHOLHDL, LDLDIRECT in the last 72 hours. Thyroid Function Tests: No  results for input(s): TSH, T4TOTAL, FREET4, T3FREE, THYROIDAB in the last 72 hours. Anemia Panel: No results for input(s): VITAMINB12, FOLATE, FERRITIN, TIBC, IRON, RETICCTPCT in the last 72 hours. Urine analysis:    Component Value Date/Time   COLORURINE YELLOW 04/10/2019 1900   APPEARANCEUR CLEAR 04/10/2019 1900   LABSPEC 1.014 04/10/2019 1900   PHURINE 7.0 04/10/2019 1900   GLUCOSEU NEGATIVE 04/10/2019 1900   HGBUR NEGATIVE 04/10/2019 1900   BILIRUBINUR NEGATIVE 04/10/2019 1900   KETONESUR NEGATIVE 04/10/2019 1900   PROTEINUR NEGATIVE 04/10/2019 1900   NITRITE NEGATIVE 04/10/2019 1900   LEUKOCYTESUR NEGATIVE 04/10/2019 1900    Radiological Exams on Admission: CT Head Wo Contrast  Result Date: 04/10/2019 CLINICAL DATA:  Altered mental status EXAM: CT HEAD WITHOUT CONTRAST TECHNIQUE: Contiguous axial images were obtained from the base of the skull through the vertex without intravenous contrast. COMPARISON:  MRI 04/30/2018, CT 04/30/2018 FINDINGS: Brain: No acute territorial infarction, hemorrhage or intracranial mass. Small focus of cortical and subcortical encephalomalacia in the right frontal lobe as before. Mild atrophy. Patchy hypodensity in the white matter consistent with chronic small vessel ischemic change. Stable ventricle size Vascular: No hyperdense vessel. Carotid vascular calcification. Vertebral calcification Skull: Normal. Negative for fracture or focal lesion. Sinuses/Orbits: Mild mucosal thickening in the ethmoid and sphenoid sinuses Other: None IMPRESSION: 1. No CT evidence for acute intracranial abnormality. 2. Atrophy and mild chronic small vessel ischemic change of the white matter. Small chronic right frontal lobe infarct Electronically Signed   By: Jasmine Pang M.D.   On: 04/10/2019 19:45   DG Chest Portable 1 View  Result Date: 04/10/2019 CLINICAL DATA:  Altered mental status EXAM: PORTABLE CHEST 1 VIEW COMPARISON:  April 30, 2018 FINDINGS: The lung volumes are  low. There is a probable airspace opacity at the left lung base with an associated left-sided pleural effusion. There is no pneumothorax. The heart size is stable from prior study. There is no acute osseous abnormality. IMPRESSION: 1. Airspace opacity at the left lung base may represent atelectasis or pneumonia. 2. Probable small left-sided pleural effusion. 3. Stable cardiac silhouette. Electronically Signed   By: Katherine Mantle M.D.   On: 04/10/2019 16:39    EKG: Independently reviewed.  Sinus rhythm.  RBBB and LAFB.  No significant change since prior tracing.  Assessment/Plan Principal Problem:   CAP (community acquired pneumonia) Active Problems:   Acute metabolic encephalopathy   Parkinson's disease (HCC)   CAP: Based on history provided by family, patient started having COVID-19 symptoms on 2/3 and tested positive on 2/5.  His illness lasted approximately 10 days and he recovered without any treatment.  Chest x-ray done at this time showing airspace opacity at the left lung base concerning for pneumonia.  Suspect due to bacterial superinfection.  Labs showing leukopenia but no fever or signs of sepsis.  No tachypnea or hypoxia. Plan: Continue ceftriaxone and azithromycin.  Check procalcitonin level.  Blood culture x2 pending.  Continue to monitor WBC count.  Continuous pulse ox, supplemental oxygen as needed.  We will continue airborne and contact precautions, however, do not feel there is an indication for repeat testing for COVID-19 at this time.  Acute metabolic encephalopathy: Suspect due to acute illness/pneumonia.  Head CT negative for acute intracranial abnormality.  Neuro exam very limited as patient is currently quite somnolent.  Ammonia level normal.  UA not suggestive of infection. Plan: Treatment of pneumonia as mentioned above. Check B12 and TSH levels.  Brain MRI has been ordered.  Parkinson's disease/ dementia: Resume home medication(s) after pharmacy medication  reconciliation has been done.  DVT prophylaxis: SCDs at this time, repeat CBC to check platelet count pending Code Status: Full code Family Communication: No family available at this time. Disposition Plan: Anticipate discharge after improvement in mental status.  Currently needs IV antibiotics for treatment of pneumonia. Consults called: None Admission status: It is my clinical opinion that admission to INPATIENT is reasonable and necessary because of the expectation that this patient will require hospital care that crosses at least 2 midnights to treat this condition based on the medical complexity of the problems presented.  Given the aforementioned information, the predictability of an adverse outcome is felt to be significant.  The medical decision making on this patient was of high complexity and the patient is at high risk for clinical deterioration, therefore this is a level 3 visit.  John Giovanni MD [facility]  If 7PM-7AM, please contact night-coverage [email or web address]  04/10/2019, 10:44 PM

## 2019-04-10 NOTE — ED Notes (Signed)
Off the floor to CT 

## 2019-04-10 NOTE — ED Provider Notes (Signed)
[facility] EMERGENCY DEPARTMENT Provider Note   CSN: [id number] Arrival date & time: 04/10/19  1549     History Chief Complaint  Patient presents with  . Altered Mental Status    Louis Harris is a 85 y.o. male history of anxiety, depression, GERD, Parkinson's, dementia.  Patient arrives from [place] for concern of altered mental status.  Per triage note patient from assisted living facility, awake but confused to time place and event.  Confusion ongoing since 6 AM has a history of dementia.  On my initial evaluation patient is resting comfortably no acute distress, appears tired.  He is alert to his name, he is quiet spoken.  He would not provide any additional information, when asked additional orientation questions he does not respond.  He briefly follows commands, wiggling fingers and toes bilaterally.  Level 5 caveat altered mental status  HPI     Past Medical History:  Diagnosis Date  . Anxiety   . Arthritis   . Depression   . GERD (gastroesophageal reflux disease)   . Parkinson disease (HCC)   . Shortness of breath    exertion    Patient Active Problem List   Diagnosis Date Noted  . CAP (community acquired pneumonia) 04/10/2019  . Pneumonia 05/01/2018  . PNA (pneumonia) 04/30/2018  . Acute metabolic encephalopathy 04/30/2018  . Anxiety   . Depression   . GERD (gastroesophageal reflux disease)   . Arthritis   . Shortness of breath 01/21/2011  . Pure hypercholesterolemia 01/21/2011    Past Surgical History:  Procedure Laterality Date  . CATARACT EXTRACTION W/PHACO Left 12/12/2012   Procedure: CATARACT EXTRACTION PHACO AND INTRAOCULAR LENS PLACEMENT (IOC);  Surgeon: Shade Flood, MD;  Location: [facility] OR;  Service: Ophthalmology;  Laterality: Left;  . CERVICAL SPINE SURGERY    . LEFT HEART CATHETERIZATION WITH CORONARY ANGIOGRAM N/A 01/21/2011   Procedure: LEFT HEART CATHETERIZATION WITH CORONARY ANGIOGRAM;  Surgeon: Donato Schultz,  MD;  Location: [facility] CATH LAB;  Service: Cardiovascular;  Laterality: N/A;       Family History  Problem Relation Age of Onset  . Heart attack Father   . Lupus Sister     Social History   Tobacco Use  . Smoking status: Never Smoker  . Smokeless tobacco: Never Used  Substance Use Topics  . Alcohol use: No  . Drug use: No    Home Medications Prior to Admission medications   Medication Sig Start Date End Date Taking? Authorizing Provider  albuterol (PROVENTIL HFA;VENTOLIN HFA) 108 (90 BASE) MCG/ACT inhaler Inhale 2 puffs into the lungs every 6 (six) hours as needed for wheezing.    [provider]  aspirin EC 81 MG tablet Take 81 mg by mouth daily.      [provider]  calcium citrate-vitamin D (CITRACAL+D) 315-200 MG-UNIT tablet Take 1 tablet by mouth daily.     [provider]  calcium gluconate 500 MG tablet Take 500 mg by mouth daily.     [provider]  cycloSPORINE (RESTASIS) 0.05 % ophthalmic emulsion Place 1 drop into both eyes 2 (two) times daily.    [provider]  doxazosin (CARDURA) 4 MG tablet Take 4 mg by mouth daily.     [provider]  finasteride (PROSCAR) 5 MG tablet Take 5 mg by mouth daily.    [provider]  fish oil-omega-3 fatty acids 1000 MG capsule Take 1 g by mouth daily.      [provider]  fluticasone (FLONASE) 50 MCG/ACT nasal spray Place 1 spray into both nostrils daily as needed for allergies or rhinitis.    [provider]  gabapentin (NEURONTIN) 100 MG capsule Take 100 mg by mouth at bedtime.     [provider]  ibuprofen (ADVIL,MOTRIN) 200 MG tablet Take 200-600 mg by mouth every 6 (six) hours as needed for pain.     [provider]  meloxicam (MOBIC) 15 MG tablet Take 15 mg by mouth daily.     [provider]  memantine (NAMENDA) 10 MG tablet Take 10 mg by mouth 2 (two) times daily.    [provider]  Multiple Vitamins-Minerals  (MULTIVITAMINS THER. W/MINERALS) TABS Take 1 tablet by mouth daily.      [provider]  pantoprazole (PROTONIX) 40 MG tablet Take 40 mg by mouth daily.    [provider]  Polyethyl Glycol-Propyl Glycol (SYSTANE) 0.4-0.3 % GEL ophthalmic gel Place 1 application 2 (two) times daily as needed into both eyes (dryness).    [provider]  pramipexole (MIRAPEX) 0.125 MG tablet Take 0.25 mg by mouth at bedtime.     [provider]  rivastigmine (EXELON) 3 MG capsule Take 3 mg by mouth See admin instructions. Start date 02/28/18 3 mg for 2 weeks daily 3 mg BID for 2 weeks 3 mg daily and 6 mg in the evening at supper for 2 weeks 6 mg twice a day    [provider]    Allergies    Diovan [valsartan] and Prilosec [omeprazole]  Review of Systems   Review of Systems  Unable to perform ROS: Age    Physical Exam Updated Vital Signs BP (!) 144/82   Pulse 71   Temp 98.2 F (36.8 C) (Oral)   Resp 16   Ht 5\' 7"  (1.702 m)   Wt 83.9 kg   SpO2 100%   BMI 28.98 kg/m   Physical Exam Constitutional:      General: He is not in acute distress.    Appearance: Normal appearance. He is well-developed. He is not ill-appearing or diaphoretic.  HENT:     Head: Normocephalic and atraumatic.     Right Ear: External ear normal.     Left Ear: External ear normal.     Nose: Nose normal.  Eyes:     General: Vision grossly intact. Gaze aligned appropriately.     Pupils: Pupils are equal, round, and reactive to light.  Neck:     Trachea: Trachea and phonation normal. No tracheal deviation.  Pulmonary:     Effort: Pulmonary effort is normal. No respiratory distress.  Abdominal:     General: There is no distension.     Palpations: Abdomen is soft.     Tenderness: There is no abdominal tenderness. There is no guarding or rebound.  Musculoskeletal:        General: Normal range of motion.     Cervical back: Normal range of motion.  Skin:    General: Skin is  warm and dry.  Neurological:     Mental Status: He is alert.     GCS: GCS eye subscore is 4. GCS verbal subscore is 4. GCS motor subscore is 6.     Comments: Speech quiet, briefly follows commands bilaterally Major cranial nerves without deficit, no facial droop Minimal but equal movement of bilateral fingers and toes when asked to squeeze my hands and move feet.  Psychiatric:        Behavior: Behavior  normal.    ED Results / Procedures / Treatments   Labs (all labs ordered are listed, but only abnormal results are displayed) Labs Reviewed  CBC WITH DIFFERENTIAL/PLATELET - Abnormal; Notable for the following components:      Result Value   WBC 2.9 (*)    All other components within normal limits  COMPREHENSIVE METABOLIC PANEL - Abnormal; Notable for the following components:   Glucose, Bld 102 (*)    BUN 7 (*)    Total Protein 6.0 (*)    Albumin 3.4 (*)    All other components within normal limits  CULTURE, BLOOD (ROUTINE X 2)  CULTURE, BLOOD (ROUTINE X 2)  URINALYSIS, ROUTINE W REFLEX MICROSCOPIC  AMMONIA  CBG MONITORING, ED    EKG EKG Interpretation  Date/Time:  Wednesday April 10 2019 16:21:42 EST Ventricular Rate:  70 PR Interval:    QRS Duration: 128 QT Interval:  443 QTC Calculation: 478 R Axis:   -44 Text Interpretation: Sinus rhythm RBBB and LAFB no significant change since Mar 2020 Confirmed by Pricilla Loveless [phone]) on 04/10/2019 6:05:57 PM   Radiology CT Head Wo Contrast  Result Date: 04/10/2019 CLINICAL DATA:  Altered mental status EXAM: CT HEAD WITHOUT CONTRAST TECHNIQUE: Contiguous axial images were obtained from the base of the skull through the vertex without intravenous contrast. COMPARISON:  MRI 04/30/2018, CT 04/30/2018 FINDINGS: Brain: No acute territorial infarction, hemorrhage or intracranial mass. Small focus of cortical and subcortical encephalomalacia in the right frontal lobe as before. Mild atrophy. Patchy hypodensity in the white matter  consistent with chronic small vessel ischemic change. Stable ventricle size Vascular: No hyperdense vessel. Carotid vascular calcification. Vertebral calcification Skull: Normal. Negative for fracture or focal lesion. Sinuses/Orbits: Mild mucosal thickening in the ethmoid and sphenoid sinuses Other: None IMPRESSION: 1. No CT evidence for acute intracranial abnormality. 2. Atrophy and mild chronic small vessel ischemic change of the white matter. Small chronic right frontal lobe infarct Electronically Signed   By: Jasmine Pang M.D.   On: 04/10/2019 19:45   DG Chest Portable 1 View  Result Date: 04/10/2019 CLINICAL DATA:  Altered mental status EXAM: PORTABLE CHEST 1 VIEW COMPARISON:  April 30, 2018 FINDINGS: The lung volumes are low. There is a probable airspace opacity at the left lung base with an associated left-sided pleural effusion. There is no pneumothorax. The heart size is stable from prior study. There is no acute osseous abnormality. IMPRESSION: 1. Airspace opacity at the left lung base may represent atelectasis or pneumonia. 2. Probable small left-sided pleural effusion. 3. Stable cardiac silhouette. Electronically Signed   By: Katherine Mantle M.D.   On: 04/10/2019 16:39    Procedures Procedures (including critical care time)  Medications Ordered in ED Medications  cefTRIAXone (ROCEPHIN) 1 g in sodium chloride 0.9 % 100 mL IVPB (0 g Intravenous Stopped 04/10/19 1852)  azithromycin (ZITHROMAX) 500 mg in sodium chloride 0.9 % 250 mL IVPB (0 mg Intravenous Stopped 04/10/19 2023)    ED Course  I have reviewed the triage vital signs and the nursing notes.  Pertinent labs & imaging results that were available during my care of the patient were reviewed by me and considered in my medical decision making (see chart for details).  Clinical Course as of Apr 10 2047  Wed Apr 10, 2019  1605 Maudry Mayhew: [phone]; No answer   [BM]  1609 [facility]) [phone] : Filbert Schilder [phone]   [BM]  1703 + 2/5   [BM]  2033 Hospitalist   [BM]    Clinical Course User Index [BM] Elizabeth Palau   MDM Rules/Calculators/A&P                      85 year old male with history of dementia and Parkinson's arrived from assisted living facility.  Minimal additional information is available during my initial evaluation.  He is alert and oriented to name which she can clearly speak, he does not respond when asked other orientation questions.  He will briefly follow commands, he wiggles bilateral fingers and toes equally.  His airway is intact, he is in no acute distress.  Vital signs are stable.  Altered mental status work-up begun with blood work, urinalysis, chest x-ray and CT head.  Confusion appears to have been ongoing for approximately 10 hours on initial evaluation.  He is no cranial nerve deficit or unilateral weakness on exam.  He has no neglect.  Does not appear to be LVO at this time.  I attempted to obtain additional information from listed phone number for Mrs. Mayford Knife but there is no answer.  I then called the listed phone number which transferred me to [facility], I then spoke to a Production designer, theatre/television/film there named Minerva Areola.  He informed me that patient lives in an independent living facility and staff do not regularly have contact with the patient.  He reports that EMS was called by patient's cousin Mrs. Mayford Knife today but he is not quite sure why she called EMS. - Chest x-ray:  IMPRESSION:  1. Airspace opacity at the left lung base may represent atelectasis  or pneumonia.  2. Probable small left-sided pleural effusion.  3. Stable cardiac silhouette.   I have personally reviewed patient's chest x-ray and agree with radiologist interpretation.  Azithromycin and Rocephin has been empirically ordered for coverage of suspected community-acquired pneumonia. - 5:03 PM: I was able to contact patient's power of attorney Maudry Mayhew via phone number  [phone].  She informs me that she has been staying with the patient throughout the month of February.  She reports that on February 3 patient developed symptoms of COVID-19, he then tested positive on 03/22/2019 for COVID-19.  She reports that he had been feeling ill for approximately 10 days following his diagnosis and she stayed with him throughout taking care of him, he was never hospitalized or sought outside medical treatment for Covid.  She reports that she had been treating him with zinc and vitamins.  She reports that over the past few days patient seem to have finally overcome COVID-19 infection and was acting normally again, previously he had had a lot of fatigue and difficulty walking and performing ADLs.  She reports that last night patient had some confusion and difficulty getting into bed and then when he woke up this morning around 6 AM he seemed more confused.  Mrs. Mayford Knife reports that patient's abnormal activity has continued throughout the day, he appears confused to her and would not eat breakfast this morning which is very abnormal for the patient, he would not follow any basic commands or perform his physical therapy routine.  She called EMS after a physical therapist came out to the home and became concerned with his behavior.  - Lab work reviewed overall reassuring.  Mild leukopenia may be explained by patient's recent Covid infection or by possible pneumonia today.  Ammonia within normal limits.  CMP without acute electrolyte abnormalities, elevation LFTs or kidney injury.  Urinalysis without evidence of infection.  CBG 94 no evidence of hypoglycemia.  CT Head:    IMPRESSION:  1. No CT evidence for acute intracranial abnormality.  2. Atrophy and mild chronic small vessel ischemic change of the  white matter. Small chronic right frontal lobe infarct  Patient reevaluated resting comfortably, sleeping no acute distress.  Easily arousable to voice.  His cousin Ms. Mayford Knife is at  bedside.  I have updated her on findings today.  She is agreeable for admission to hospitalist for further evaluation.  Patient was seen and evaluated Dr. Criss Alvine during this visit.  Suspect patient encephalopathic secondary to infection, community-acquired pneumonia.  He has been started on antibiotics today.  Blood cultures were obtained after initiation of Rocephin.  Low suspicion for this to be CVA today, no emergent indication for MRI at this time will admit to hospitalist. Vital signs have remained stable throughout ER visit no hypoxia, hypotension or tachycardia.  I discussed the case with hospitalist Dr. Loney Loh who has admitted patient to her service.  Additionally as patient Covid +19 days ago do not feel there is an indication for repeat Covid test at this time.  Mubarak Bevens was evaluated in Emergency Department on 04/10/2019 for the symptoms described in the history of present illness. He was evaluated in the context of the global COVID-19 pandemic, which necessitated consideration that the patient might be at risk for infection with the SARS-CoV-2 virus that causes COVID-19. Institutional protocols and algorithms that pertain to the evaluation of patients at risk for COVID-19 are in a state of rapid change based on information released by regulatory bodies including the CDC and federal and state organizations. These policies and algorithms were followed during the patient's care in the ED.  Note: Portions of this report may have been transcribed using voice recognition software. Every effort was made to ensure accuracy; however, inadvertent computerized transcription errors may still be present. Final Clinical Impression(s) / ED Diagnoses Final diagnoses:  Community acquired pneumonia, unspecified laterality  Encephalopathy    Rx / DC Orders ED Discharge Orders    None       Elizabeth Palau 04/10/19 2052    Pricilla Loveless, MD 04/11/19 [phone]

## 2019-04-10 NOTE — ED Triage Notes (Signed)
Pt/ presents from ALF, awake but confused to place, time and events since today 6 am as per family. Hx Alzheimer's.

## 2019-04-11 DIAGNOSIS — G9341 Metabolic encephalopathy: Secondary | ICD-10-CM

## 2019-04-11 DIAGNOSIS — Z8616 Personal history of COVID-19: Secondary | ICD-10-CM

## 2019-04-11 DIAGNOSIS — F0390 Unspecified dementia without behavioral disturbance: Secondary | ICD-10-CM

## 2019-04-11 DIAGNOSIS — R5381 Other malaise: Secondary | ICD-10-CM

## 2019-04-11 DIAGNOSIS — R1310 Dysphagia, unspecified: Secondary | ICD-10-CM

## 2019-04-11 DIAGNOSIS — R531 Weakness: Secondary | ICD-10-CM

## 2019-04-11 DIAGNOSIS — R9389 Abnormal findings on diagnostic imaging of other specified body structures: Secondary | ICD-10-CM

## 2019-04-11 DIAGNOSIS — G20 Parkinson's disease: Secondary | ICD-10-CM

## 2019-04-11 LAB — TSH: TSH: 2.062 u[IU]/mL (ref 0.350–4.500)

## 2019-04-11 LAB — CBC
HCT: 40.5 % (ref 39.0–52.0)
Hemoglobin: 13.4 g/dL (ref 13.0–17.0)
MCH: 30.7 pg (ref 26.0–34.0)
MCHC: 33.1 g/dL (ref 30.0–36.0)
MCV: 92.9 fL (ref 80.0–100.0)
Platelets: 103 10*3/uL — ABNORMAL LOW (ref 150–400)
RBC: 4.36 MIL/uL (ref 4.22–5.81)
RDW: 14.6 % (ref 11.5–15.5)
WBC: 4.7 10*3/uL (ref 4.0–10.5)
nRBC: 0 % (ref 0.0–0.2)

## 2019-04-11 LAB — PROCALCITONIN: Procalcitonin: <0.1 ng/mL

## 2019-04-11 LAB — VITAMIN B12: Vitamin B-12: 541 pg/mL (ref 180–914)

## 2019-04-11 LAB — SARS CORONAVIRUS 2 (TAT 6-24 HRS): SARS Coronavirus 2: POSITIVE — AB

## 2019-04-11 LAB — POC SARS CORONAVIRUS 2 AG -  ED: SARS Coronavirus 2 Ag: NEGATIVE

## 2019-04-11 MED ORDER — CHOLECALCIFEROL 10 MCG (400 UNIT) PO TABS
200.0000 [IU] | ORAL_TABLET | Freq: Every day | ORAL | Status: DC
  Administered 2019-04-11 – 2019-04-12 (×2): 200 [IU] via ORAL
  Filled 2019-04-11 (×2): qty 1

## 2019-04-11 MED ORDER — FLUTICASONE PROPIONATE 50 MCG/ACT NA SUSP
1.0000 | Freq: Every day | NASAL | Status: DC | PRN
  Filled 2019-04-11: qty 16

## 2019-04-11 MED ORDER — POLYETHYL GLYCOL-PROPYL GLYCOL 0.4-0.3 % OP GEL: 1.0000 "application " | Freq: Two times a day (BID) | OPHTHALMIC | Status: DC | PRN

## 2019-04-11 MED ORDER — RESOURCE THICKENUP CLEAR PO POWD
ORAL | Status: DC | PRN
  Filled 2019-04-11: qty 125

## 2019-04-11 MED ORDER — ADULT MULTIVITAMIN W/MINERALS CH
1.0000 | ORAL_TABLET | Freq: Every day | ORAL | Status: DC
  Administered 2019-04-11 – 2019-04-12 (×2): 1 via ORAL
  Filled 2019-04-11 (×2): qty 1

## 2019-04-11 MED ORDER — POLYVINYL ALCOHOL 1.4 % OP SOLN
1.0000 [drp] | Freq: Two times a day (BID) | OPHTHALMIC | Status: DC | PRN
  Filled 2019-04-11: qty 15

## 2019-04-11 MED ORDER — ALBUTEROL SULFATE HFA 108 (90 BASE) MCG/ACT IN AERS
2.0000 | INHALATION_SPRAY | Freq: Four times a day (QID) | RESPIRATORY_TRACT | Status: DC | PRN
  Filled 2019-04-11: qty 6.7

## 2019-04-11 MED ORDER — RIVASTIGMINE TARTRATE 1.5 MG PO CAPS
6.0000 mg | ORAL_CAPSULE | Freq: Two times a day (BID) | ORAL | Status: DC
  Administered 2019-04-11 – 2019-04-12 (×2): 6 mg via ORAL
  Filled 2019-04-11 (×3): qty 4

## 2019-04-11 MED ORDER — CARBIDOPA-LEVODOPA 25-100 MG PO TABS
1.0000 | ORAL_TABLET | Freq: Three times a day (TID) | ORAL | Status: DC
  Administered 2019-04-11 – 2019-04-12 (×3): 1 via ORAL
  Filled 2019-04-11 (×3): qty 1

## 2019-04-11 MED ORDER — CARBOXYMETHYLCELLULOSE SODIUM 0.5 % OP SOLN: 1.0000 [drp] | Freq: Every day | OPHTHALMIC | Status: DC

## 2019-04-11 MED ORDER — GABAPENTIN 300 MG PO CAPS
300.0000 mg | ORAL_CAPSULE | Freq: Every day | ORAL | Status: DC
  Administered 2019-04-11: 300 mg via ORAL
  Filled 2019-04-11: qty 1

## 2019-04-11 MED ORDER — PRAMIPEXOLE DIHYDROCHLORIDE 0.125 MG PO TABS
0.1250 mg | ORAL_TABLET | Freq: Every day | ORAL | Status: DC
  Administered 2019-04-11: 0.125 mg via ORAL
  Filled 2019-04-11 (×2): qty 1

## 2019-04-11 MED ORDER — FINASTERIDE 5 MG PO TABS
5.0000 mg | ORAL_TABLET | Freq: Every day | ORAL | Status: DC
  Administered 2019-04-11 – 2019-04-12 (×2): 5 mg via ORAL
  Filled 2019-04-11 (×2): qty 1

## 2019-04-11 MED ORDER — CALCIUM CITRATE-VITAMIN D 500-500 MG-UNIT PO CHEW: 0.5000 | CHEWABLE_TABLET | Freq: Every day | ORAL | Status: DC

## 2019-04-11 MED ORDER — ASPIRIN EC 81 MG PO TBEC
81.0000 mg | DELAYED_RELEASE_TABLET | Freq: Every day | ORAL | Status: DC
  Administered 2019-04-11 – 2019-04-12 (×2): 81 mg via ORAL
  Filled 2019-04-11 (×2): qty 1

## 2019-04-11 MED ORDER — CYCLOSPORINE 0.05 % OP EMUL
1.0000 [drp] | Freq: Two times a day (BID) | OPHTHALMIC | Status: DC
  Administered 2019-04-11 – 2019-04-12 (×2): 1 [drp] via OPHTHALMIC
  Filled 2019-04-11 (×3): qty 1

## 2019-04-11 MED ORDER — STARCH (THICKENING) PO POWD: ORAL | Status: DC | PRN

## 2019-04-11 MED ORDER — PANTOPRAZOLE SODIUM 40 MG PO TBEC
40.0000 mg | DELAYED_RELEASE_TABLET | Freq: Every day | ORAL | Status: DC
  Administered 2019-04-11 – 2019-04-12 (×2): 40 mg via ORAL
  Filled 2019-04-11 (×2): qty 1

## 2019-04-11 MED ORDER — MEMANTINE HCL 5 MG PO TABS
10.0000 mg | ORAL_TABLET | Freq: Two times a day (BID) | ORAL | Status: DC
  Administered 2019-04-11 – 2019-04-12 (×2): 10 mg via ORAL
  Filled 2019-04-11 (×2): qty 2

## 2019-04-11 MED ORDER — POLYVINYL ALCOHOL 1.4 % OP SOLN
1.0000 [drp] | Freq: Every day | OPHTHALMIC | Status: DC
  Administered 2019-04-11: 1 [drp] via OPHTHALMIC
  Filled 2019-04-11: qty 15

## 2019-04-11 MED ORDER — CALCIUM CITRATE 950 (200 CA) MG PO TABS
300.0000 mg | ORAL_TABLET | Freq: Every day | ORAL | Status: DC
  Administered 2019-04-11 – 2019-04-12 (×2): 300 mg via ORAL
  Filled 2019-04-11 (×2): qty 2

## 2019-04-11 MED ORDER — CALCIUM CITRATE-VITAMIN D 315-200 MG-UNIT PO TABS: 1.0000 | ORAL_TABLET | Freq: Every day | ORAL | Status: DC

## 2019-04-11 MED ORDER — DOXAZOSIN MESYLATE 4 MG PO TABS
4.0000 mg | ORAL_TABLET | Freq: Every day | ORAL | Status: DC
  Administered 2019-04-11 – 2019-04-12 (×2): 4 mg via ORAL
  Filled 2019-04-11 (×2): qty 1

## 2019-04-11 MED ORDER — ENOXAPARIN SODIUM 40 MG/0.4ML ~~LOC~~ SOLN
40.0000 mg | SUBCUTANEOUS | Status: DC
  Administered 2019-04-11: 40 mg via SUBCUTANEOUS
  Filled 2019-04-11: qty 0.4

## 2019-04-11 MED ORDER — SODIUM CHLORIDE 0.9 % IV SOLN: INTRAVENOUS | Status: AC

## 2019-04-11 NOTE — Progress Notes (Signed)
Lab called with critical result at 0740. Patient positive for COVID, swap collected 04/11/19 0217. Almon Hercules MD paged to made aware of result.

## 2019-04-11 NOTE — Progress Notes (Signed)
PROGRESS NOTE  Louis Harris EHM:[id number] DOB: 12/08/1934   PCP: Seward Carol, MD  Patient is from: ILF.  Dependent for most ADLs since he was diagnosed with COVID-19 on 03/22/2019.  Has home health PT.  DOA: 04/10/2019 LOS: 1  Brief Narrative / Interim history: 85 year old male with history of advanced dementia, Parkinson's disease, anxiety, depression, osteoarthritis, GERD, debility and recent diagnosis for COVID-19 at ALF on 03/22/2019 presenting with altered mental status, lethargy and decreased p.o. intake for about a day.  Patient was very somnolent on admission.   In ED, HDS.  100% on RA.  WBC 2.9.  CMP and UA without significant finding.  Ammonia was normal.  POC COVID-19 antigen test negative.  CT head without acute finding.  CXR showed "airspace opacity at left lung base and probable small left-sided pleural effusion".  Cultures drawn.  Started on azithromycin and ceftriaxone, and admitted for community-acquired pneumonia.  MRI brain ordered on admission.  COVID-19 PCR positive.  Patient has no fever, cardiopulmonary or GI symptoms.  Saturations in upper 90s to 100 on RA.  Subjective: Patient awake and alert this morning but only oriented to self.  Follows some command.  Responds no to pain. Patient's arrived later in the day and states that patient's mental status is not quite close to his baseline.   Objective: Vitals:   04/10/19 2102 04/11/19 0217 04/11/19 0333 04/11/19 0421  BP: 116/82 110/79 (!) 162/101 130/82  Pulse: 82 68 81 72  Resp: 17 12 16 20   Temp:    98 F (36.7 C)  TempSrc:    Axillary  SpO2: 97% 98% 98%   Weight:    68.5 kg  Height:        Intake/Output Summary (Last 24 hours) at 04/11/2019 1247 Last data filed at 04/11/2019 0446 Gross per 24 hour  Intake 15.75 ml  Output --  Net 15.75 ml   Filed Weights   04/10/19 1604 04/11/19 0421  Weight: 83.9 kg 68.5 kg    Examination:  GENERAL: No apparent distress.  Nontoxic. HEENT: MMM.  Vision and hearing  grossly intact.  NECK: Supple.  No apparent JVD.  RESP: 100% on RA.  No IWOB.  Fair aeration bilaterally. CVS:  RRR. Heart sounds normal.  ABD/GI/GU: Bowel sounds present. Soft. Non tender.  MSK/EXT:  Moves extremities. No apparent deformity. No edema.  SKIN: no apparent skin lesion or wound NEURO: Awake but oriented only to self.  Follows some command.  No apparent focal neuro deficit other than BLE weakness although this could be limited due to cognition. PSYCH: Calm.  No agitation or distress.  Procedures:  None  Assessment & Plan: Acute metabolic encephalopathy in patient with advanced dementia: delirium?  Work-up including CBC, CT head, CMP, MRI brain, UA, TSH, B12 and ammonia not revealing.  He has abnormal CXR but no clinical signs of pneumonia.  This could be residual from his recent COVID-19 infection.  Mental status improved but not quite back to baseline per family.  Also not able to take p.o. safely when evaluated by RN. -Frequent reorientation, delirium, fall and aspiration precautions -Continue IV fluid for hydration until he is able to swallow safely -PT/OT/SLP eval  Abnormal chest x-ray: CXR reviewed and reveals left lung base opacity but no recent CXR to compare to.  This could be a residual from his recent COVID-19 infection.  I am not convinced that he has bacterial pneumonia without fever, leukocytosis or respiratory symptoms.  Procalcitonin within normal. -Continue antibiotics until culture  results are back.  History of COVID-19 infection: Reportedly received Moderna vaccine on 03/17/2018 and tested positive for COVID-19 on 03/22/2019. He hasn't had fever, respiratory or GI symptoms other than overall deconditioning.  He was quarantined for 10 days with improvement in his symptoms.  COVID-19 PCR now positive which is expected.  I doubt patient has active disease at this time.  -Will obtain COVID-19 result from ALF. -Discontinue isolation precautions per [facility]  guidelines  Parkinson's disease -Continue home medications when able to swallow safely  Anxiety/depression -Continue home medications when able to swallow safely  Generalized weakness/debility -PT/OT eval  Leukopenia: Resolved.  Chronic thrombocytopenia: At baseline. -Continue monitoring   Goal of care discussion: Patient with advanced dementia, Parkinson disease and recent COVID-19 infection.  Dependent for most ADL's since COVID-19 infection.  He is currently full code.  Extensively discussed the pros and cons of his CODE STATUS with his Cousin and HCPOA, Mrs. Mayford Knife.  Recommended DNR/DNI but Mrs. Mayford Knife want him to stay full code.               DVT prophylaxis: Subcu Lovenox Code Status: Full code Family Communication: Updated patient's cousin, HC POA over the phone and at bedside  Discharge barrier: Acute encephalopathy, dysphagia and debility.  Patient is from: ILF Final disposition: Likely back to ALF when encephalopathy resolves  Consultants: None   Microbiology summarized: COVID-19 PCR positive Blood cultures pending  Sch Meds:  Scheduled Meds: . enoxaparin (LOVENOX) injection  40 mg Subcutaneous Q24H   Continuous Infusions: . azithromycin    . cefTRIAXone (ROCEPHIN)  IV     PRN Meds:.acetaminophen **OR** acetaminophen  Antimicrobials: Anti-infectives (From admission, onward)   Start     Dose/Rate Route Frequency Ordered Stop   04/11/19 1700  azithromycin (ZITHROMAX) 500 mg in sodium chloride 0.9 % 250 mL IVPB     500 mg 250 mL/hr over 60 Minutes Intravenous Every 24 hours 04/10/19 2241     04/11/19 1700  cefTRIAXone (ROCEPHIN) 1 g in sodium chloride 0.9 % 100 mL IVPB     1 g 200 mL/hr over 30 Minutes Intravenous Every 24 hours 04/10/19 2241     04/10/19 1715  cefTRIAXone (ROCEPHIN) 1 g in sodium chloride 0.9 % 100 mL IVPB     1 g 200 mL/hr over 30 Minutes Intravenous  Once 04/10/19 1704 04/10/19 1852   04/10/19 1715  azithromycin  (ZITHROMAX) 500 mg in sodium chloride 0.9 % 250 mL IVPB     500 mg 250 mL/hr over 60 Minutes Intravenous  Once 04/10/19 1704 04/10/19 2023       I have personally reviewed the following labs and images: CBC: Recent Labs  Lab 04/10/19 1715 04/11/19 0057  WBC 2.9* 4.7  NEUTROABS 1.9  --   HGB 13.8 13.4  HCT 43.1 40.5  MCV 95.4 92.9  PLT PLATELET CLUMPS NOTED ON SMEAR, COUNT APPEARS DECREASED 103*   BMP &GFR Recent Labs  Lab 04/10/19 1715  NA 139  K 4.3  CL 105  CO2 23  GLUCOSE 102*  BUN 7*  CREATININE 0.97  CALCIUM 9.0   Estimated Creatinine Clearance: 54.9 mL/min (by C-G formula based on SCr of 0.97 mg/dL). Liver & Pancreas: Recent Labs  Lab 04/10/19 1715  AST 19  ALT 10  ALKPHOS 58  BILITOT 0.8  PROT 6.0*  ALBUMIN 3.4*   No results for input(s): LIPASE, AMYLASE in the last 168 hours. Recent Labs  Lab 04/10/19 1700  AMMONIA 23  Diabetic: No results for input(s): HGBA1C in the last 72 hours. Recent Labs  Lab 04/10/19 1717  GLUCAP 94   Cardiac Enzymes: No results for input(s): CKTOTAL, CKMB, CKMBINDEX, TROPONINI in the last 168 hours. No results for input(s): PROBNP in the last 8760 hours. Coagulation Profile: No results for input(s): INR, PROTIME in the last 168 hours. Thyroid Function Tests: Recent Labs    04/11/19 0058  TSH 2.062   Lipid Profile: No results for input(s): CHOL, HDL, LDLCALC, TRIG, CHOLHDL, LDLDIRECT in the last 72 hours. Anemia Panel: Recent Labs    04/11/19 0057  VITAMINB12 541   Urine analysis:    Component Value Date/Time   COLORURINE YELLOW 04/10/2019 1900   APPEARANCEUR CLEAR 04/10/2019 1900   LABSPEC 1.014 04/10/2019 1900   PHURINE 7.0 04/10/2019 1900   GLUCOSEU NEGATIVE 04/10/2019 1900   HGBUR NEGATIVE 04/10/2019 1900   BILIRUBINUR NEGATIVE 04/10/2019 1900   KETONESUR NEGATIVE 04/10/2019 1900   PROTEINUR NEGATIVE 04/10/2019 1900   NITRITE NEGATIVE 04/10/2019 1900   LEUKOCYTESUR NEGATIVE 04/10/2019 1900    Sepsis Labs: Invalid input(s): PROCALCITONIN, LACTICIDVEN  Microbiology: Recent Results (from the past 240 hour(s))  SARS CORONAVIRUS 2 (TAT 6-24 HRS) Nasopharyngeal Nasopharyngeal Swab     Status: Abnormal   Collection Time: 04/11/19  2:17 AM   Specimen: Nasopharyngeal Swab  Result Value Ref Range Status   SARS Coronavirus 2 POSITIVE (A) NEGATIVE Final    Comment: RESULT CALLED TO, READ BACK BY AND VERIFIED WITH: RN L GIMENEZ [id number] AT 741 AM BY CM (NOTE) SARS-CoV-2 target nucleic acids are DETECTED. The SARS-CoV-2 RNA is generally detectable in upper and lower respiratory specimens during the acute phase of infection. Positive results are indicative of the presence of SARS-CoV-2 RNA. Clinical correlation with patient history and other diagnostic information is  necessary to determine patient infection status. Positive results do not rule out bacterial infection or co-infection with other viruses.  The expected result is Negative. Fact Sheet for Patients: HairSlick.no Fact Sheet for Healthcare Providers: quierodirigir.com This test is not yet approved or cleared by the [place] FDA and  has been authorized for detection and/or diagnosis of SARS-CoV-2 by FDA under an Emergency Use Authorization (EUA). This EUA will remain  in effect (meaning this test can be used) for the  duration of the COVID-19 declaration under Section 564(b)(1) of the Act, 21 U.S.C. section 360bbb-3(b)(1), unless the authorization is terminated or revoked sooner. Performed at [facility], [address], [place], [place] [address]     Radiology Studies: CT Head Wo Contrast  Result Date: 04/10/2019 CLINICAL DATA:  Altered mental status EXAM: CT HEAD WITHOUT CONTRAST TECHNIQUE: Contiguous axial images were obtained from the base of the skull through the vertex without intravenous contrast. COMPARISON:  MRI 04/30/2018, CT 04/30/2018  FINDINGS: Brain: No acute territorial infarction, hemorrhage or intracranial mass. Small focus of cortical and subcortical encephalomalacia in the right frontal lobe as before. Mild atrophy. Patchy hypodensity in the white matter consistent with chronic small vessel ischemic change. Stable ventricle size Vascular: No hyperdense vessel. Carotid vascular calcification. Vertebral calcification Skull: Normal. Negative for fracture or focal lesion. Sinuses/Orbits: Mild mucosal thickening in the ethmoid and sphenoid sinuses Other: None IMPRESSION: 1. No CT evidence for acute intracranial abnormality. 2. Atrophy and mild chronic small vessel ischemic change of the white matter. Small chronic right frontal lobe infarct Electronically Signed   By: Jasmine Pang M.D.   On: 04/10/2019 19:45   MR BRAIN WO CONTRAST  Result Date: 04/11/2019 CLINICAL DATA:  Initial evaluation for acute encephalopathy. EXAM: MRI HEAD WITHOUT CONTRAST TECHNIQUE: Multiplanar, multiecho pulse sequences of the brain and surrounding structures were obtained without intravenous contrast. COMPARISON:  Prior head CT from 04/10/2019. FINDINGS: Brain: Examination moderately degraded by motion artifact. Generalized age-related cerebral atrophy. Confluent T2/FLAIR hyperintensity within the periventricular white matter most consistent with chronic small vessel ischemic disease, mild in nature. Small remote right frontal cortical infarct again noted. Additional tiny remote right cerebellar infarct, unchanged. No abnormal foci of restricted diffusion to suggest acute or subacute ischemia. Gray-white matter differentiation maintained. No other areas of remote cortical infarction. No evidence for acute or chronic intracranial hemorrhage. No mass lesion, midline shift or mass effect. No hydrocephalus. No extra-axial fluid collection. Pituitary gland suprasellar region normal. Midline structures intact. Vascular: Major intracranial vascular flow voids are  maintained. Skull and upper cervical spine: Craniocervical junction within normal limits. Bone marrow signal intensity normal. No scalp soft tissue abnormality. Sinuses/Orbits: Patient status post bilateral ocular lens replacement. Globes and orbital soft tissues demonstrate no acute finding. Small volume layering fluid noted within the left sphenoid sinus. Paranasal sinuses are otherwise largely clear. No significant mastoid effusion. Other: None. IMPRESSION: 1. No acute intracranial abnormality. 2. Small remote right frontal cortical infarct, with additional tiny remote right cerebellar infarcts, unchanged. 3. Mild age-related cerebral atrophy with chronic small vessel ischemic disease. Electronically Signed   By: Rise Mu M.D.   On: 04/11/2019 00:56   DG Chest Portable 1 View  Result Date: 04/10/2019 CLINICAL DATA:  Altered mental status EXAM: PORTABLE CHEST 1 VIEW COMPARISON:  April 30, 2018 FINDINGS: The lung volumes are low. There is a probable airspace opacity at the left lung base with an associated left-sided pleural effusion. There is no pneumothorax. The heart size is stable from prior study. There is no acute osseous abnormality. IMPRESSION: 1. Airspace opacity at the left lung base may represent atelectasis or pneumonia. 2. Probable small left-sided pleural effusion. 3. Stable cardiac silhouette. Electronically Signed   By: Katherine Mantle M.D.   On: 04/10/2019 16:39     Skyylar Kopf T. Asante Blanda [facility]  If 7PM-7AM, please contact night-coverage [email or web address] Password [facility] 04/11/2019, 12:47 PM

## 2019-04-11 NOTE — ED Notes (Signed)
Pt suctioned with younker

## 2019-04-11 NOTE — Evaluation (Signed)
Physical Therapy Evaluation Patient Details Name: Louis Harris MRN: [id number] DOB: [birth year] Today's Date: 04/11/2019   History of Present Illness  Pt adm with AMS and found to have CAP. Pt with recent covid infection diagnosed 03/22/2019. PMH - dementia, Parkinsons, anxiety, depression, arthritis  Clinical Impression  Pt presents to PT requiring assistance for all mobility. Pt with very poor initiation. Pt's cousin present and reports she assisted him after initial covid diagnosis and plans to assist him 24/7 after this hospitalization until he recovers. If cousin changes her mind and is not available to provide 24 assist pt would need SNF.    Follow Up Recommendations Home health PT;Supervision/Assistance - 24 hour(cousing plans to stay with pt and assist 24/7)    Equipment Recommendations  None recommended by PT    Recommendations for Other Services       Precautions / Restrictions Precautions Precautions: Fall Restrictions Weight Bearing Restrictions: No      Mobility  Bed Mobility Overal bed mobility: Needs Assistance Bed Mobility: Supine to Sit     Supine to sit: +2 for physical assistance;+2 for safety/equipment;Total assist     General bed mobility comments: Assist to bring legs off of bed, elevate trunk into sitting, and bring hips to EOB.  Transfers Overall transfer level: Needs assistance Equipment used: Rolling walker (2 wheeled) Transfers: Sit to/from Stand Sit to Stand: Mod assist;+2 physical assistance;+2 safety/equipment         General transfer comment: Assist to bring hips up and for balance.   Ambulation/Gait Ambulation/Gait assistance: Min assist;+2 safety/equipment Gait Distance (Feet): 8 Feet Assistive device: Rolling walker (2 wheeled) Gait Pattern/deviations: Step-to pattern;Decreased step length - right;Decreased step length - left;Shuffle;Trunk flexed Gait velocity: decr Gait velocity interpretation: <1.31 ft/sec, indicative of household  ambulator General Gait Details: Assist with balance. Verbal cues to initiate  Stairs            Wheelchair Mobility    Modified Rankin (Stroke Patients Only)       Balance Overall balance assessment: Needs assistance Sitting-balance support: No upper extremity supported;Feet supported Sitting balance-Leahy Scale: Poor Sitting balance - Comments: R lateral lean statically sitting in chair, requires min guard to min assist for safety without support Postural control: Right lateral lean Standing balance support: Bilateral upper extremity supported;During functional activity Standing balance-Leahy Scale: Poor Standing balance comment: walker and min assist for static standing                             Pertinent Vitals/Pain Pain Assessment: No/denies pain    Home Living Family/patient expects to be discharged to:: Private residence(independent living ) Living Arrangements: Alone Available Help at Discharge: Family;Available 24 hours/day Type of Home: Independent living facility Home Access: Level entry     Home Layout: One level Home Equipment: Walker - 2 wheels;Cane - single point;Shower seat;Bedside commode(getting transport chair )      Prior Function Level of Independence: Needs assistance   Gait / Transfers Assistance Needed: independnet or using RW for mobility prior to covid, increased assist since covid   ADL's / Homemaking Assistance Needed: was dresssing independently, bathing with supervision prior to coivd; increased assist since   Comments: assist wtih IADls      Hand Dominance        Extremity/Trunk Assessment   Upper Extremity Assessment Upper Extremity Assessment: Defer to OT evaluation    Lower Extremity Assessment Lower Extremity Assessment: Generalized weakness  Communication   Communication: (soft spoken, limited verbalizations)  Cognition Arousal/Alertness: Awake/alert Behavior During Therapy: Flat  affect Overall Cognitive Status: History of cognitive impairments - at baseline                                 General Comments: pt oriented to self, following simple commands with increased time but poor initation, sequencing and problem sovling      General Comments General comments (skin integrity, edema, etc.): VSS with pt on RA. Pt's cousin present and reporting she will stay with him to assist until he recovers.    Exercises     Assessment/Plan    PT Assessment Patient needs continued PT services  PT Problem List Decreased strength;Decreased activity tolerance;Decreased balance;Decreased mobility;Decreased cognition       PT Treatment Interventions DME instruction;Gait training;Functional mobility training;Therapeutic activities;Therapeutic exercise;Balance training;Patient/family education    PT Goals (Current goals can be found in the Care Plan section)  Acute Rehab PT Goals Patient Stated Goal: none stated PT Goal Formulation: With patient/family Time For Goal Achievement: 04/25/19 Potential to Achieve Goals: Good    Frequency Min 3X/week   Barriers to discharge        Co-evaluation PT/OT/SLP Co-Evaluation/Treatment: Yes Reason for Co-Treatment: Necessary to address cognition/behavior during functional activity;For patient/therapist safety PT goals addressed during session: Mobility/safety with mobility;Balance OT goals addressed during session: ADL's and self-care       AM-PAC PT "6 Clicks" Mobility  Outcome Measure Help needed turning from your back to your side while in a flat bed without using bedrails?: Total Help needed moving from lying on your back to sitting on the side of a flat bed without using bedrails?: Total Help needed moving to and from a bed to a chair (including a wheelchair)?: A Lot Help needed standing up from a chair using your arms (e.g., wheelchair or bedside chair)?: A Lot Help needed to walk in hospital room?: A  Little Help needed climbing 3-5 steps with a railing? : Total 6 Click Score: 10    End of Session Equipment Utilized During Treatment: Gait belt Activity Tolerance: Patient limited by fatigue Patient left: in chair;with call bell/phone within reach;with chair alarm set;with family/visitor present Nurse Communication: Mobility status PT Visit Diagnosis: Unsteadiness on feet (R26.81);Other abnormalities of gait and mobility (R26.89);Muscle weakness (generalized) (M62.81)    Time: 6962-9528 PT Time Calculation (min) (ACUTE ONLY): 23 min   Charges:   PT Evaluation $PT Eval Moderate Complexity: [place] Pager [phone] [place] 04/11/2019, 4:10 PM

## 2019-04-11 NOTE — ED Notes (Signed)
Reported poc covid  negetive to DR [facility].

## 2019-04-11 NOTE — Evaluation (Signed)
Clinical/Bedside Swallow Evaluation Patient Details  Name: Louis Harris MRN: [id number] Date of Birth: 05-11-1934  Today's Date: 04/11/2019 Time: SLP Start Time (ACUTE ONLY): 1345 SLP Stop Time (ACUTE ONLY): 1410 SLP Time Calculation (min) (ACUTE ONLY): 25 min  Past Medical History:  Past Medical History:  Diagnosis Date  . Anxiety   . Arthritis   . Depression   . GERD (gastroesophageal reflux disease)   . Parkinson disease (HCC)   . Shortness of breath    exertion   Past Surgical History:  Past Surgical History:  Procedure Laterality Date  . CATARACT EXTRACTION W/PHACO Left 12/12/2012   Procedure: CATARACT EXTRACTION PHACO AND INTRAOCULAR LENS PLACEMENT (IOC);  Surgeon: Shade Flood, MD;  Location: [facility] OR;  Service: Ophthalmology;  Laterality: Left;  . CERVICAL SPINE SURGERY    . LEFT HEART CATHETERIZATION WITH CORONARY ANGIOGRAM N/A 01/21/2011   Procedure: LEFT HEART CATHETERIZATION WITH CORONARY ANGIOGRAM;  Surgeon: Donato Schultz, MD;  Location: [facility] CATH LAB;  Service: Cardiovascular;  Laterality: N/A;   HPI:  85 y.o. male admitted 04/10/19 with AMS. PMH: anxiety, depression, arthritis, GERD, Parkinson's disease, dementia, SOB. Covid + 2/5. MRI = No acute intracranial abnormality. Small remote right frontal cortical infarct, with additional tiny remote right cerebellar infarcts, unchanged. Mild age-related cerebral atrophy with chronic small vessel ischemic disease. CXR = airspace opacity at the left lung base may represent atelectasis or pneumonia.   Assessment / Plan / Recommendation Clinical Impression  Pt underwent BSE and MBS March 2020, with recommendation for regular solids and nectar thick liquids. Today, pt presents with significant weakness, and very low vocal intensity with subsequent decreased intelligibility. Adequate natural dentition. Generalized oral motor weakness noted. Pt was unable to demonstrate volitional cough or volitional swallow, and had difficulty following  other commands consistently. Trials of thin liquid, nectar thick liquid, puree, and solid textures were given. Extended oral prep and multiple swallows were noted across consistencies. Delayed cough response noted after thin liquids.   Recommend dys 2 textures with nectar thick liquids, crushed meds. Safe swallow precautions were left for RN to post at [facility]. RN and MD informed of results and recommendations. SLP will follow for diet tolerance and education.    SLP Visit Diagnosis: Dysphagia, unspecified (R13.10)    Aspiration Risk  Moderate aspiration risk;Risk for inadequate nutrition/hydration    Diet Recommendation Dysphagia 2 (Fine chop);Nectar-thick liquid   Liquid Administration via: Cup;Straw Medication Administration: Crushed with puree Supervision: Full supervision/cueing for compensatory strategies;Staff to assist with self feeding Compensations: Minimize environmental distractions;Slow rate;Small sips/bites Postural Changes: Remain upright for at least 30 minutes after po intake;Seated upright at 90 degrees    Other  Recommendations Oral Care Recommendations: Oral care BID Other Recommendations: Order thickener from pharmacy;Prohibited food (jello, ice cream, thin soups)   Follow up Recommendations 24 hour supervision/assistance      Frequency and Duration min 2x/week  1 week;2 weeks       Prognosis Prognosis for Safe Diet Advancement: Fair Barriers to Reach Goals: Cognitive deficits(Parkinson's disease)      Swallow Study   General Date of Onset: 04/10/19 HPI: 85 y.o. male admitted 04/10/19 with AMS. PMH: anxiety, depression, arthritis, GERD, Parkinson's disease, dementia, SOB. Covid + 2/5. MRI = No acute intracranial abnormality. Small remote right frontal cortical infarct, with additional tiny remote right cerebellar infarcts, unchanged. Mild age-related cerebral atrophy with chronic small vessel ischemic disease. CXR = airspace opacity at the left lung base may  represent atelectasis or pneumonia. Type of Study: Bedside  Swallow Evaluation Previous Swallow Assessment: BSE/MBS March 2020 - Reg/NTL Diet Prior to this Study: NPO Temperature Spikes Noted: No Respiratory Status: Room air History of Recent Intubation: No Behavior/Cognition: Alert;Cooperative;Requires cueing;Doesn't follow directions Oral Cavity Assessment: Within Functional Limits Oral Care Completed by SLP: No Oral Cavity - Dentition: Adequate natural dentition Vision: Functional for self-feeding Patient Positioning: Upright in bed Baseline Vocal Quality: Low vocal intensity Volitional Cough: Cognitively unable to elicit Volitional Swallow: Unable to elicit    Oral/Motor/Sensory Function Overall Oral Motor/Sensory Function: Generalized oral weakness   Ice Chips Ice chips: Not tested   Thin Liquid Thin Liquid: Impaired Oral Phase Functional Implications: Prolonged oral transit;Oral holding Pharyngeal  Phase Impairments: Suspected delayed Swallow;Decreased hyoid-laryngeal movement;Multiple swallows;Cough - Delayed    Nectar Thick Nectar Thick Liquid: Within functional limits Presentation: Cup;Straw   Honey Thick Honey Thick Liquid: Not tested   Puree Puree: Impaired Oral Phase Functional Implications: Prolonged oral transit;Oral holding Pharyngeal Phase Impairments: Suspected delayed Swallow;Decreased hyoid-laryngeal movement;Multiple swallows   Solid     Solid: Impaired Oral Phase Impairments: Impaired mastication Oral Phase Functional Implications: Prolonged oral transit Pharyngeal Phase Impairments: Suspected delayed Swallow;Decreased hyoid-laryngeal movement;Multiple swallows     Jesslynn Kruck B. Quentin Ore, [facility], [place] Speech Language Pathologist Office: [phone] Pager: [phone]  Shonna Chock 04/11/2019,2:30 PM

## 2019-04-11 NOTE — ED Notes (Signed)
Pt contact called, stated pt tested positive for COVID 03-24-2019 at his Assisted living facility

## 2019-04-11 NOTE — Social Work (Signed)
Pt is from [employer].   [name], MSW, LCSW [facility] Clinical Social Work

## 2019-04-11 NOTE — TOC Initial Note (Addendum)
Transition of Care [facility]) - Initial/Assessment Note    Patient Details  Name: Louis Harris MRN: [id number] Date of Birth: [birth year]  [facility] CM/SW Contact:    Alexander Mt, LCSW Phone Number: 04/11/2019, 11:29 AM  Clinical Narrative:             11:46am- Regina with [facility] has called this writer back and states pt has received HHPT and OT and would need new orders to resume at discharge.      11:29am- CSW spoke with pt Engineer, maintenance (IT) at [facility]. [place] [facility] is an independent living facility.  CSW then called Legacy coordinator Rollene Fare to see if pt was active with any services at home. Await f/u from [place].   CSW called and spoke with Elba Barman [phone]), pt cousin. Pt from home as confirmed by Althea, she has been trying to honor his wishes and keep him out of a SNF (pt had been to [facility] in the past). She has been privately paying Ezequiel Kayser with an agency she thinks is called [facility] to bathe and assist pt. She assists otherwise. She has been unable to get services through the [place] at this time. We discussed that we are waiting on therapy to evaluate pt today for appropriate services. Althea is on her way to the hospital currently.    Expected Discharge Plan: [place] Barriers to Discharge: Continued Medical Work up   Patient Goals and CMS Choice Patient states their goals for this hospitalization and ongoing recovery are:: for him to return home in accordance with his wishes   Choice offered to / list presented to : [place] / Guardian  Expected Discharge Plan and Services Expected Discharge Plan: [place] In-house Referral: Clinical Social Work Discharge Planning Services: CM Consult Post Acute Care Choice: Resumption of Svcs/PTA Provider, Durable Medical Equipment, [place] arrangements for the past 2 months: [place]  Prior Living  Arrangements/Services Living arrangements for the past 2 months: [place] Lives with:: Self Patient language and need for interpreter reviewed:: Yes(no needs) Do you feel safe going back to the place where you live?: Yes      Need for Family Participation in Patient Care: Yes (Comment)(assistance with daily cares) Care giver support system in place?: Yes (comment)(private aide and pt cousin) Current home services: DME, Homehealth aide Criminal Activity/Legal Involvement Pertinent to Current Situation/Hospitalization: No - Comment as needed  Permission Sought/Granted Permission sought to share information with : Family Supports(pt cousin) Permission granted to share information with : No(pt oriented to self.)  Share Information with NAME: Elba Barman  Permission granted to share info w AGENCY: [place] [place]  Permission granted to share info w Relationship: cousin  Permission granted to share info w Contact Information: [phone]  Emotional Assessment Appearance:: Other (Comment Required(telephonic assessment with cousin) Attitude/Demeanor/Rapport: Other (comment)(telephonic assessment with cousin) Affect (typically observed): Other (comment)(telephonic assessment with cousin) Orientation: : Oriented to Self, Fluctuating Orientation (Suspected and/or reported Sundowners) Alcohol / Substance Use: Not Applicable Psych Involvement: No (comment)  Admission diagnosis:  Encephalopathy [G93.40] CAP (community acquired pneumonia) [J18.9] Community acquired pneumonia, unspecified laterality [J18.9] Patient Active Problem List   Diagnosis Date Noted  . CAP (community acquired pneumonia) 04/10/2019  . Parkinson's disease ([place]) 04/10/2019  . Pneumonia 05/01/2018  . PNA (pneumonia) 04/30/2018  . Acute metabolic encephalopathy 22/29/7989  . Anxiety   . Depression   . GERD (gastroesophageal reflux disease)   .  Arthritis   . Shortness of breath 01/21/2011   . Pure hypercholesterolemia 01/21/2011   PCP:  Renford Dills, MD Pharmacy:   [facility] [address], [place] - [address] [place] [address] Phone: [phone] Fax: [phone]  [facility] SERVICE - [place], [place] - [address] [facility] [address] [place] [address] [place] [address] Phone: [phone] Fax: [phone]  [facility] [place], [place] - [address] [phone] [facility] MEDICAL [facility] [place] [address] Phone: [phone] Fax: [phone]  Readmission Risk Interventions Readmission Risk Prevention Plan 04/11/2019 05/02/2018  Post Dischage Appt Complete Complete  Medication Screening Complete Complete  Transportation Screening Complete Complete  Some recent data might be hidden

## 2019-04-11 NOTE — Evaluation (Addendum)
Occupational Therapy Evaluation Patient Details Name: Louis Harris MRN: [id number] DOB: 1934-07-09 Today's Date: 04/11/2019    History of Present Illness Pt adm with AMS and found to have CAP. Pt with recent covid infection diagnosed 03/22/2019. PMH - dementia, Parkinsons, anxiety, depression, arthritis   Clinical Impression   Patient admitted for above, limited by problem list below including impaired balance, generalized weakness, decreased activity tolerance and impaired cognition. Patients cousin present and reports he was ambulating with RW and completing basic ADLs with independence prior to getting COVID (supervision for showers) but since COVID required increased assist and was receiving therapy and progressing towards baseline. Currently requires total assist +2 for bed mobility, mod assist +2 for transfers and max-total assist for ADLs.  Family reports she is comfortable taking care of him at this level, as she did when he had COVID; reporting she will provide 24/7 physical assist.  Will follow acutely and recommend continued OT services [place] level after discharge with aide as well.  (If family cannot provide physical assist he needs, patient will require SNF).     Follow Up Recommendations  Home health OT;Supervision/Assistance - 24 hour    Equipment Recommendations  None recommended by OT    Recommendations for Other Services       Precautions / Restrictions Precautions Precautions: Fall Restrictions Weight Bearing Restrictions: No      Mobility Bed Mobility Overal bed mobility: Needs Assistance Bed Mobility: Supine to Sit     Supine to sit: +2 for physical assistance;+2 for safety/equipment;Total assist     General bed mobility comments: requires support for BLES and trunk,  poor initation and sequencing   Transfers Overall transfer level: Needs assistance Equipment used: Rolling walker (2 wheeled) Transfers: Sit to/from Stand Sit to Stand: Mod assist;+2 physical  assistance;+2 safety/equipment         General transfer comment: mod assist +2 to power up and steady, hand over hand support for hand placement with transitions to standing and sitting; poor intiation and sequencing     Balance Overall balance assessment: Needs assistance Sitting-balance support: No upper extremity supported;Feet supported Sitting balance-Leahy Scale: Fair Sitting balance - Comments: R lateral lean statically sitting in chair, requires min guard to min assist for safety without support Postural control: Right lateral lean Standing balance support: Bilateral upper extremity supported;During functional activity Standing balance-Leahy Scale: Poor Standing balance comment: relaint on BUE and external support                           ADL either performed or assessed with clinical judgement   ADL Overall ADL's : Needs assistance/impaired     Grooming: Maximal assistance;Sitting   Upper Body Bathing: Maximal assistance;Sitting   Lower Body Bathing: Total assistance;Sit to/from stand;+2 for physical assistance   Upper Body Dressing : Maximal assistance;Sitting   Lower Body Dressing: Total assistance;+2 for physical assistance;Sit to/from stand   Toilet Transfer: +2 for physical assistance;+2 for safety/equipment;Moderate assistance;RW;Ambulation Toilet Transfer Details (indicate cue type and reason): simulated in room Toileting- Clothing Manipulation and Hygiene: Total assistance;+2 for physical assistance;Sit to/from stand       Functional mobility during ADLs: Moderate assistance;+2 for physical assistance;+2 for safety/equipment;Rolling walker General ADL Comments: pt limited by cognition, impaired balance, weakness and decreased activity tolearnce     Vision   Additional Comments: to be assessed     Perception     Praxis      Pertinent Vitals/Pain Pain Assessment: No/denies  pain     Hand Dominance     Extremity/Trunk Assessment Upper  Extremity Assessment Upper Extremity Assessment: Generalized weakness(hypertonicity and limited active movement, PROM WFL )   Lower Extremity Assessment Lower Extremity Assessment: Defer to PT evaluation       Communication Communication Communication: (soft spoken, limited verbalizations)   Cognition Arousal/Alertness: Awake/alert Behavior During Therapy: Flat affect Overall Cognitive Status: History of cognitive impairments - at baseline                                 General Comments: pt oriented to self, following simple commands with increased time but poor initation, sequencing and problem sovling   General Comments  pts cousin present and reports plan to stay with him as long as needed     Exercises     Shoulder Instructions      Home Living Family/patient expects to be discharged to:: Private residence(independent living ) Living Arrangements: Alone Available Help at Discharge: Family;Available 24 hours/day Type of Home: Independent living facility Home Access: Level entry     Home Layout: One level     Bathroom Shower/Tub: Producer, television/film/video: Handicapped height     Home Equipment: Environmental consultant - 2 wheels;Cane - single point;Shower seat;Bedside commode(getting transport chair )          Prior Functioning/Environment Level of Independence: Needs assistance  Gait / Transfers Assistance Needed: independnet or using RW for mobility prior to covid, increased assist since covid  ADL's / Homemaking Assistance Needed: was dresssing independently, bathing with supervision prior to coivd; increased assist since    Comments: assist wtih IADls         OT Problem List: Decreased strength;Decreased range of motion;Decreased activity tolerance;Impaired balance (sitting and/or standing);Decreased coordination;Decreased cognition;Decreased safety awareness;Decreased knowledge of precautions;Decreased knowledge of use of DME or AE      OT  Treatment/Interventions: Self-care/ADL training;DME and/or AE instruction;Energy conservation;Neuromuscular education;Therapeutic activities;Balance training;Patient/family education;Cognitive remediation/compensation    OT Goals(Current goals can be found in the care plan section) Acute Rehab OT Goals Patient Stated Goal: none stated OT Goal Formulation: Patient unable to participate in goal setting Time For Goal Achievement: 04/25/19 Potential to Achieve Goals: Fair  OT Frequency: Min 2X/week   Barriers to D/C:            Co-evaluation PT/OT/SLP Co-Evaluation/Treatment: Yes Reason for Co-Treatment: Necessary to address cognition/behavior during functional activity;For patient/therapist safety;To address functional/ADL transfers   OT goals addressed during session: ADL's and self-care      AM-PAC OT "6 Clicks" Daily Activity     Outcome Measure Help from another person eating meals?: A Lot Help from another person taking care of personal grooming?: A Lot Help from another person toileting, which includes using toliet, bedpan, or urinal?: Total Help from another person bathing (including washing, rinsing, drying)?: A Lot Help from another person to put on and taking off regular upper body clothing?: A Lot Help from another person to put on and taking off regular lower body clothing?: Total 6 Click Score: 10   End of Session Equipment Utilized During Treatment: Rolling walker;Gait belt Nurse Communication: Mobility status  Activity Tolerance: Patient tolerated treatment well Patient left: in chair;with call bell/phone within reach;with chair alarm set;with nursing/sitter in room;with family/visitor present  OT Visit Diagnosis: Other abnormalities of gait and mobility (R26.89);Muscle weakness (generalized) (M62.81);Other symptoms and signs involving cognitive function  Time: 1420-1450 OT Time Calculation (min): 30 min Charges:  OT General Charges $OT Visit: 1  Visit OT Evaluation $OT Eval Moderate Complexity: 1 Mod  Barry Brunner, OT Acute Rehabilitation Services Pager [phone] Office [phone]    Chancy Milroy 04/11/2019, 3:31 PM

## 2019-04-12 ENCOUNTER — Inpatient Hospital Stay (HOSPITAL_COMMUNITY): Payer: Medicare Other

## 2019-04-12 DIAGNOSIS — R1311 Dysphagia, oral phase: Secondary | ICD-10-CM

## 2019-04-12 DIAGNOSIS — F329 Major depressive disorder, single episode, unspecified: Secondary | ICD-10-CM

## 2019-04-12 DIAGNOSIS — F419 Anxiety disorder, unspecified: Secondary | ICD-10-CM

## 2019-04-12 DIAGNOSIS — F10231 Alcohol dependence with withdrawal delirium: Secondary | ICD-10-CM

## 2019-04-12 LAB — CBC
HCT: 38.3 % — ABNORMAL LOW (ref 39.0–52.0)
Hemoglobin: 12.3 g/dL — ABNORMAL LOW (ref 13.0–17.0)
MCH: 30.6 pg (ref 26.0–34.0)
MCHC: 32.1 g/dL (ref 30.0–36.0)
MCV: 95.3 fL (ref 80.0–100.0)
Platelets: 98 10*3/uL — ABNORMAL LOW (ref 150–400)
RBC: 4.02 MIL/uL — ABNORMAL LOW (ref 4.22–5.81)
RDW: 14.6 % (ref 11.5–15.5)
WBC: 3.4 10*3/uL — ABNORMAL LOW (ref 4.0–10.5)
nRBC: 0 % (ref 0.0–0.2)

## 2019-04-12 LAB — RENAL FUNCTION PANEL
Albumin: 3.1 g/dL — ABNORMAL LOW (ref 3.5–5.0)
Anion gap: 9 (ref 5–15)
BUN: 9 mg/dL (ref 8–23)
CO2: 23 mmol/L (ref 22–32)
Calcium: 8.9 mg/dL (ref 8.9–10.3)
Chloride: 108 mmol/L (ref 98–111)
Creatinine, Ser: 0.76 mg/dL (ref 0.61–1.24)
GFR calc Af Amer: >60 mL/min (ref >60)
GFR calc non Af Amer: >60 mL/min (ref >60)
Glucose, Bld: 89 mg/dL (ref 70–99)
Phosphorus: 3.1 mg/dL (ref 2.5–4.6)
Potassium: 3.8 mmol/L (ref 3.5–5.1)
Sodium: 140 mmol/L (ref 135–145)

## 2019-04-12 LAB — MAGNESIUM: Magnesium: 1.8 mg/dL (ref 1.7–2.4)

## 2019-04-12 MED ORDER — ACETAMINOPHEN 500 MG PO TABS: 1000.0000 mg | ORAL_TABLET | Freq: Three times a day (TID) | ORAL | 1 refills | Status: AC

## 2019-04-12 MED ORDER — GABAPENTIN 300 MG PO CAPS: 300.0000 mg | ORAL_CAPSULE | Freq: Every day | ORAL | Status: AC

## 2019-04-12 NOTE — Progress Notes (Signed)
Initial Nutrition Assessment  RD working remotely.   DOCUMENTATION CODES:   Not applicable  INTERVENTION:  Hormel Shake TID, each supplement provides 520 kcals and 22 grams of protein   Continue MVI daily  NUTRITION DIAGNOSIS:   Inadequate oral intake related to dysphagia as evidenced by meal completion < 50%.    GOAL:   Patient will meet greater than or equal to 90% of their needs    MONITOR:   PO intake, Supplement acceptance, Diet advancement, Weight trends, Labs  REASON FOR ASSESSMENT:   Consult Assessment of nutrition requirement/status  ASSESSMENT:   Pt with a PMH significant for advanced dementia, Parkinson's disease, anxiety, depression, osteoarthritis, GERD, debility and recent diagnosis for COVID-19 (03/22/2019) presenting with AMS, lethargy and decreased p.o. intake for about a day  Discussed pt with RN.   Discussed pt with pt's POA who reports pt does not like the food and will likely not have improved intake for this reason. Pt's POA thinks he would do well with a Hormel shake.   Pt's POA reports pt's appetite PTA was great prior to getting COVID; she states he still ate 3x times a day over the last month while he was sick, but states his intake was significantly reduced. POA reports pt was eating well yesterday before being admitted.   Based on available wts in chart, pt's wt has been stable over the last year. POA unable to say whether the pt lost wt recently.  PO intake: 50% x 2 recorded meals  Medications reviewed and include: Calcitrate, Vitamin D3, MVI  Labs reviewed.  UOP: x24 hours I/O: -225.74ml since admit   NUTRITION - FOCUSED PHYSICAL EXAM:  RD unable to perform at this time.   Diet Order:   Diet Order            Diet general        DIET DYS 2 Room service appropriate? No; Fluid consistency: Nectar Thick  Diet effective now              EDUCATION NEEDS:   Not appropriate for education at this time  Skin:  Skin  Assessment: Reviewed RN Assessment  Last BM:  PTA  Height:   Ht Readings from Last 1 Encounters:  04/10/19 5\' 7"  (1.702 m)    Weight:   Wt Readings from Last 1 Encounters:  04/12/19 69.7 kg     BMI:  Body mass index is 24.07 kg/m.  Estimated Nutritional Needs:   Kcal:  1650-1850  Protein:  80-90 grams  Fluid:  >1.65L/d   04/14/19, MS, RD, LDN RD pager number and weekend/on-call pager number located in Amion.

## 2019-04-12 NOTE — Discharge Summary (Signed)
Physician Discharge Summary  Louis Harris YYT:[id number] DOB: [birth year] DOA: 04/10/2019  PCP: Seward Carol, MD  Admit date: 04/10/2019 Discharge date: 04/12/2019  Admitted From: Independent living facility Disposition: Independent living facility  Recommendations for Outpatient Follow-up:  1. Follow ups as below. 2. Please obtain CBC/BMP/Mag at follow up 3. Please follow up on the following pending results: None  Home Health: PT/OT Equipment/Devices: Patient has appropriate DME at facility.  Discharge Condition: Stable CODE STATUS: Full code   Hospital Course: 85 year old male with history of advanced dementia, Parkinson's disease, anxiety, depression, osteoarthritis, GERD, debility and recent diagnosis for COVID-19 at ALF on 03/22/2019 presenting with altered mental status, lethargy and decreased p.o. intake for about a day.  Patient was very somnolent on admission.   In ED, HDS.  100% on RA.  WBC 2.9.  CMP and UA without significant finding.  Ammonia was normal.  POC COVID-19 antigen test negative.  CT head without acute finding.  CXR showed "airspace opacity at left lung base and probable small left-sided pleural effusion".  Cultures drawn.  Started on azithromycin and ceftriaxone, and admitted for community-acquired pneumonia.  MRI brain ordered on admission.  COVID-19 PCR positive.  Patient has no fever, cardiopulmonary or GI symptoms.  Saturations in upper 90s to 100 on RA.  Patient continued to have waxing and waning mental status.  Pneumonia thought to be unlikely.  Antibiotics discontinued.  Repeat chest x-ray without significant finding.  Patient evaluated by PT/OT who recommended home health per family request.  Patient will be discharged to independent living facility with home health.  Discharge Diagnoses:  Acute metabolic encephalopathy in patient with advanced dementia: Seems to be waxing and waning.  Likely delirium. Work-up including CBC, CT head, CMP, MRI brain,  UA, TSH, B12 and ammonia not revealing.    Initial CXR reported as left lower lobe opacity which could be residual from his recent COVID-19 infection.  He has no fever, leukocytosis or respiratory symptoms to suggest pneumonia.  Repeat CXR without significant finding. -Frequent reorientation, delirium, fall and aspiration precautions -Ensure adequate hydration and oral intake -SLP recommended dysphagia 2 diet. -Continue PT/OT  Abnormal chest x-ray: Pneumonia unlikely. Left lung base opacity  on initial CXR likely residual from previous COVID-19 infection. Repeat CXR without acute finding.  But no recent CXR to compare to.    He has no respiratory symptoms, leukocytosis, fever or lactic acidosis to suggest infectious process.  Procalcitonin negative.  Remained stable off antibiotics.  History of COVID-19 infection: Reportedly received Moderna vaccine on 03/17/2018 and tested positive for COVID-19 on 03/22/2019. He hasn't had fever, respiratory or GI symptoms other than overall deconditioning.  He was quarantined for 10 days with improvement in his symptoms.  COVID-19 PCR now positive which is expected.  I doubt patient has active disease at this time.  -Discontinue isolation precautions per [facility] guidelines  Parkinson's disease -Continue home medications  Anxiety/depression -Continue home medications  -Changed his gabapentin to nightly and discontinued morning dose  Generalized weakness/debility -Continue therapy  Leukopenia: Resolved.  Chronic thrombocytopenia: At baseline. -Recheck CBC at follow-up  Goal of care discussion:  Remains full code per [facility] POA .   Discharge Instructions  Discharge Instructions    Call MD for:  difficulty breathing, headache or visual disturbances   Complete by: As directed    Call MD for:  temperature >100.4   Complete by: As directed    Diet general   Complete by: As directed    Dysphagia-2 diet  Discharge instructions   Complete by: As  directed    Frequent reorientation Delirium precaution Fall precaution Aspiration precaution Ensure hydration and oral intake   Increase activity slowly   Complete by: As directed      Allergies as of 04/12/2019      Reactions   Diovan [valsartan] Diarrhea   Prilosec [omeprazole] Diarrhea      Medication List    STOP taking these medications   ibuprofen 200 MG tablet Commonly known as: ADVIL     TAKE these medications   acetaminophen 500 MG tablet Commonly known as: TYLENOL Take 2 tablets (1,000 mg total) by mouth every 8 (eight) hours.   albuterol 108 (90 Base) MCG/ACT inhaler Commonly known as: VENTOLIN HFA Inhale 2 puffs into the lungs every 6 (six) hours as needed for wheezing.   aspirin EC 81 MG tablet Take 81 mg by mouth daily.   calcium citrate-vitamin D 315-200 MG-UNIT tablet Commonly known as: CITRACAL+D Take 1 tablet by mouth daily.   calcium gluconate 500 MG tablet Take 500 mg by mouth daily.   carbidopa-levodopa 25-100 MG tablet Commonly known as: SINEMET IR Take 1 tablet by mouth 3 (three) times daily. 9a, 1p, 5p   carboxymethylcellulose 0.5 % Soln Commonly known as: REFRESH PLUS Place 1 drop into both eyes at bedtime.   cycloSPORINE 0.05 % ophthalmic emulsion Commonly known as: RESTASIS Place 1 drop into both eyes 2 (two) times daily.   doxazosin 8 MG tablet Commonly known as: CARDURA Take 4 mg by mouth daily.   fish oil-omega-3 fatty acids 1000 MG capsule Take 1 g by mouth daily.   fluticasone 50 MCG/ACT nasal spray Commonly known as: FLONASE Place 1 spray into both nostrils daily as needed for allergies or rhinitis.   gabapentin 300 MG capsule Commonly known as: NEURONTIN Take 1 capsule (300 mg total) by mouth at bedtime. What changed:   when to take this  Another medication with the same name was removed. Continue taking this medication, and follow the directions you see here.   meloxicam 15 MG tablet Commonly known as:  MOBIC Take 15 mg by mouth daily.   memantine 10 MG tablet Commonly known as: NAMENDA Take 10 mg by mouth 2 (two) times daily.   multivitamins ther. w/minerals Tabs tablet Take 1 tablet by mouth daily.   pantoprazole 40 MG tablet Commonly known as: PROTONIX Take 40 mg by mouth daily.   pramipexole 0.125 MG tablet Commonly known as: MIRAPEX Take 0.125 mg by mouth at bedtime.   Proscar 5 MG tablet Generic drug: finasteride Take 5 mg by mouth daily.   rivastigmine 6 MG capsule Commonly known as: EXELON Take 6 mg by mouth 2 (two) times daily.   Systane 0.4-0.3 % Gel ophthalmic gel Generic drug: Polyethyl Glycol-Propyl Glycol Place 1 application 2 (two) times daily as needed into both eyes (dryness).       Consultations:  None  Procedures/Studies:  None   CT Head Wo Contrast  Result Date: 04/10/2019 CLINICAL DATA:  Altered mental status EXAM: CT HEAD WITHOUT CONTRAST TECHNIQUE: Contiguous axial images were obtained from the base of the skull through the vertex without intravenous contrast. COMPARISON:  MRI 04/30/2018, CT 04/30/2018 FINDINGS: Brain: No acute territorial infarction, hemorrhage or intracranial mass. Small focus of cortical and subcortical encephalomalacia in the right frontal lobe as before. Mild atrophy. Patchy hypodensity in the white matter consistent with chronic small vessel ischemic change. Stable ventricle size Vascular: No hyperdense vessel. Carotid vascular calcification.  Vertebral calcification Skull: Normal. Negative for fracture or focal lesion. Sinuses/Orbits: Mild mucosal thickening in the ethmoid and sphenoid sinuses Other: None IMPRESSION: 1. No CT evidence for acute intracranial abnormality. 2. Atrophy and mild chronic small vessel ischemic change of the white matter. Small chronic right frontal lobe infarct Electronically Signed   By: Jasmine Pang M.D.   On: 04/10/2019 19:45   MR BRAIN WO CONTRAST  Result Date: 04/11/2019 CLINICAL DATA:   Initial evaluation for acute encephalopathy. EXAM: MRI HEAD WITHOUT CONTRAST TECHNIQUE: Multiplanar, multiecho pulse sequences of the brain and surrounding structures were obtained without intravenous contrast. COMPARISON:  Prior head CT from 04/10/2019. FINDINGS: Brain: Examination moderately degraded by motion artifact. Generalized age-related cerebral atrophy. Confluent T2/FLAIR hyperintensity within the periventricular white matter most consistent with chronic small vessel ischemic disease, mild in nature. Small remote right frontal cortical infarct again noted. Additional tiny remote right cerebellar infarct, unchanged. No abnormal foci of restricted diffusion to suggest acute or subacute ischemia. Gray-white matter differentiation maintained. No other areas of remote cortical infarction. No evidence for acute or chronic intracranial hemorrhage. No mass lesion, midline shift or mass effect. No hydrocephalus. No extra-axial fluid collection. Pituitary gland suprasellar region normal. Midline structures intact. Vascular: Major intracranial vascular flow voids are maintained. Skull and upper cervical spine: Craniocervical junction within normal limits. Bone marrow signal intensity normal. No scalp soft tissue abnormality. Sinuses/Orbits: Patient status post bilateral ocular lens replacement. Globes and orbital soft tissues demonstrate no acute finding. Small volume layering fluid noted within the left sphenoid sinus. Paranasal sinuses are otherwise largely clear. No significant mastoid effusion. Other: None. IMPRESSION: 1. No acute intracranial abnormality. 2. Small remote right frontal cortical infarct, with additional tiny remote right cerebellar infarcts, unchanged. 3. Mild age-related cerebral atrophy with chronic small vessel ischemic disease. Electronically Signed   By: Rise Mu M.D.   On: 04/11/2019 00:56   DG Chest Port 1 View  Result Date: 04/12/2019 CLINICAL DATA:  Altered mental status.  EXAM: PORTABLE CHEST 1 VIEW COMPARISON:  Single-view of the chest 04/10/2019. PA and lateral chest 04/30/2018. FINDINGS: Mild bibasilar atelectasis low. Lung volumes no pneumothorax or pleural effusion. Heart size is normal. Hiatal hernia noted. IMPRESSION: No acute disease. Hiatal hernia. Electronically Signed   By: Drusilla Kanner M.D.   On: 04/12/2019 11:25   DG Chest Portable 1 View  Result Date: 04/10/2019 CLINICAL DATA:  Altered mental status EXAM: PORTABLE CHEST 1 VIEW COMPARISON:  April 30, 2018 FINDINGS: The lung volumes are low. There is a probable airspace opacity at the left lung base with an associated left-sided pleural effusion. There is no pneumothorax. The heart size is stable from prior study. There is no acute osseous abnormality. IMPRESSION: 1. Airspace opacity at the left lung base may represent atelectasis or pneumonia. 2. Probable small left-sided pleural effusion. 3. Stable cardiac silhouette. Electronically Signed   By: Katherine Mantle M.D.   On: 04/10/2019 16:39       Discharge Exam: Vitals:   04/12/19 0349 04/12/19 0750  BP: (!) 164/88 138/72  Pulse: 70 (!) 56  Resp: 16   Temp: (!) 97.5 F (36.4 C) 97.7 F (36.5 C)  SpO2: 97% 98%    GENERAL: No apparent distress.  Nontoxic. HEENT: MMM.  Vision and hearing grossly intact.  NECK: Supple.  No apparent JVD.  RESP: On room air.  No IWOB. Good air movement bilaterally. CVS:  RRR. Heart sounds normal.  ABD/GI/GU: Bowel sounds present. Soft. Non tender.  MSK/EXT:  Moves extremities. No apparent deformity or edema.  SKIN: no apparent skin lesion or wound NEURO: Sleepy but wakes to voice.  Not oriented.  Barely follows command.  No apparent focal neuro deficit. PSYCH: Calm.  No distress or agitation.   The results of significant diagnostics from this hospitalization (including imaging, microbiology, ancillary and laboratory) are listed below for reference.     Microbiology: Recent Results (from the past 240  hour(s))  Blood culture (routine x 2)     Status: None (Preliminary result)   Collection Time: 04/10/19  6:00 PM   Specimen: BLOOD  Result Value Ref Range Status   Specimen Description BLOOD SITE NOT SPECIFIED  Final   Special Requests   Final    BOTTLES DRAWN AEROBIC AND ANAEROBIC Blood Culture adequate volume   Culture NO GROWTH < 24 HOURS  Final   Report Status PENDING  Incomplete  Blood culture (routine x 2)     Status: None (Preliminary result)   Collection Time: 04/10/19  6:30 PM   Specimen: BLOOD  Result Value Ref Range Status   Specimen Description BLOOD SITE NOT SPECIFIED  Final   Special Requests   Final    BOTTLES DRAWN AEROBIC AND ANAEROBIC Blood Culture adequate volume   Culture NO GROWTH < 24 HOURS  Final   Report Status PENDING  Incomplete  SARS CORONAVIRUS 2 (TAT 6-24 HRS) Nasopharyngeal Nasopharyngeal Swab     Status: Abnormal   Collection Time: 04/11/19  2:17 AM   Specimen: Nasopharyngeal Swab  Result Value Ref Range Status   SARS Coronavirus 2 POSITIVE (A) NEGATIVE Final    Comment: RESULT CALLED TO, READ BACK BY AND VERIFIED WITH: RN L GIMENEZ [id number] AT 741 AM BY CM (NOTE) SARS-CoV-2 target nucleic acids are DETECTED. The SARS-CoV-2 RNA is generally detectable in upper and lower respiratory specimens during the acute phase of infection. Positive results are indicative of the presence of SARS-CoV-2 RNA. Clinical correlation with patient history and other diagnostic information is  necessary to determine patient infection status. Positive results do not rule out bacterial infection or co-infection with other viruses.  The expected result is Negative. Fact Sheet for Patients: HairSlick.no Fact Sheet for Healthcare Providers: quierodirigir.com This test is not yet approved or cleared by the [place] FDA and  has been authorized for detection and/or diagnosis of SARS-CoV-2 by FDA under an Emergency Use  Authorization (EUA). This EUA will remain  in effect (meaning this test can be used) for the  duration of the COVID-19 declaration under Section 564(b)(1) of the Act, 21 U.S.C. section 360bbb-3(b)(1), unless the authorization is terminated or revoked sooner. Performed at [facility], [address] [place], [place] [address]      Labs: BNP (last 3 results) No results for input(s): BNP in the last 8760 hours. Basic Metabolic Panel: Recent Labs  Lab 04/10/19 1715 04/12/19 0315  NA 139 140  K 4.3 3.8  CL 105 108  CO2 23 23  GLUCOSE 102* 89  BUN 7* 9  CREATININE 0.97 0.76  CALCIUM 9.0 8.9  MG  --  1.8  PHOS  --  3.1   Liver Function Tests: Recent Labs  Lab 04/10/19 1715 04/12/19 0315  AST 19  --   ALT 10  --   ALKPHOS 58  --   BILITOT 0.8  --   PROT 6.0*  --   ALBUMIN 3.4* 3.1*   No results for input(s): LIPASE, AMYLASE in the last 168 hours. Recent  Labs  Lab 04/10/19 1700  AMMONIA 23   CBC: Recent Labs  Lab 04/10/19 1715 04/11/19 0057 04/12/19 0315  WBC 2.9* 4.7 3.4*  NEUTROABS 1.9  --   --   HGB 13.8 13.4 12.3*  HCT 43.1 40.5 38.3*  MCV 95.4 92.9 95.3  PLT PLATELET CLUMPS NOTED ON SMEAR, COUNT APPEARS DECREASED 103* 98*   Cardiac Enzymes: No results for input(s): CKTOTAL, CKMB, CKMBINDEX, TROPONINI in the last 168 hours. BNP: Invalid input(s): POCBNP CBG: Recent Labs  Lab 04/10/19 1717  GLUCAP 94   D-Dimer No results for input(s): DDIMER in the last 72 hours. Hgb A1c No results for input(s): HGBA1C in the last 72 hours. Lipid Profile No results for input(s): CHOL, HDL, LDLCALC, TRIG, CHOLHDL, LDLDIRECT in the last 72 hours. Thyroid function studies Recent Labs    04/11/19 0058  TSH 2.062   Anemia work up Recent Labs    04/11/19 0057  VITAMINB12 541   Urinalysis    Component Value Date/Time   COLORURINE YELLOW 04/10/2019 1900   APPEARANCEUR CLEAR 04/10/2019 1900   LABSPEC 1.014 04/10/2019 1900   PHURINE 7.0 04/10/2019  1900   GLUCOSEU NEGATIVE 04/10/2019 1900   HGBUR NEGATIVE 04/10/2019 1900   BILIRUBINUR NEGATIVE 04/10/2019 1900   KETONESUR NEGATIVE 04/10/2019 1900   PROTEINUR NEGATIVE 04/10/2019 1900   NITRITE NEGATIVE 04/10/2019 1900   LEUKOCYTESUR NEGATIVE 04/10/2019 1900   Sepsis Labs Invalid input(s): PROCALCITONIN,  WBC,  LACTICIDVEN   Time coordinating discharge: 35 minutes  SIGNED:  Almon Hercules, MD  [facility] 04/12/2019, 1:16 PM  If 7PM-7AM, please contact night-coverage [email or web address] Password [email or web address]

## 2019-04-12 NOTE — TOC Progression Note (Signed)
Transition of Care [facility]) - Progression Note    Patient Details  Name: Louis Harris MRN: [id number] Date of Birth: November 11, 1934  [facility] CM/SW Contact  Nonda Lou, [place] Phone Number: 04/12/2019, 11:33 AM  Clinical Narrative:    CSW faxed HH orders to [facility] [phone]). TOC team will continue to follow and assist with discharge needs.   Expected Discharge Plan: Home w Home Health Services Barriers to Discharge: Continued Medical Work up  Expected Discharge Plan and Services Expected Discharge Plan: Home w Home Health Services In-house Referral: Clinical Social Work Discharge Planning Services: CM Consult Post Acute Care Choice: Resumption of Svcs/PTA Provider, Durable Medical Equipment, Home Health Living arrangements for the past 2 months: Independent Living Facility                                       Social Determinants of Health (SDOH) Interventions    Readmission Risk Interventions Readmission Risk Prevention Plan 04/11/2019 05/02/2018  Post Dischage Appt Complete Complete  Medication Screening Complete Complete  Transportation Screening Complete Complete  Some recent data might be hidden

## 2019-04-12 NOTE — Progress Notes (Signed)
Physical Therapy Treatment Patient Details Name: Louis Harris MRN: [id number] DOB: [birth year] Today's Date: 04/12/2019    History of Present Illness 85 y.o. male adm on 04/10/19 with AMS and found to have acute metabolic encephalopathy and CAP. Pt with recent covid infection diagnosed 03/22/2019. PMH - dementia, Parkinsons, anxiety, depression, arthritis    PT Comments    Pt lethargic, eyes closed most of my session.  Overall mod assist to stand and transfer to the recliner chair today.  He is very weak and at high risk of falls.  He would be SNF rehab appropriate, but per chart cousin prefers to take him home and provide 24/7 care.  PT will continue to follow acutely for safe mobility progression.  A second person will be needed to progress gait.    Follow Up Recommendations  Home health PT;Supervision/Assistance - 24 hour(cousin plans to stay with pt and provide 24/7)     Equipment Recommendations  None recommended by PT    Recommendations for Other Services   NA     Precautions / Restrictions Precautions Precautions: Fall    Mobility  Bed Mobility Overal bed mobility: Needs Assistance Bed Mobility: Supine to Sit     Supine to sit: Mod assist;HOB elevated     General bed mobility comments: heavy mod assist to support trunk and initiate movement of legs to EOB with maximally elevated HOB.   Transfers Overall transfer level: Needs assistance Equipment used: Rolling walker (2 wheeled) Transfers: Sit to/from [facility] Sit to Stand: Mod assist;From elevated surface Stand pivot transfers: Mod assist;From elevated surface       General transfer comment: Mod assist to stand from elevated bed,  Pt needed both hands on RW to help initiate movement to stand and decrease instability with movement from hands on bed to hands on RW.  Very slow, shuffling steps with decreased ability to initiate stepping to command, needed multimodal cues.  uncontrolled premature plop  down into the recliner still holding the RW (unable to release to sit down).   Ambulation/Gait             General Gait Details: not safe to walk with only one person assist at this time.           Balance Overall balance assessment: Needs assistance Sitting-balance support: Bilateral upper extremity supported;Feet supported Sitting balance-Leahy Scale: Poor Sitting balance - Comments: ever increasing left lateral lean today supervision initially progressing to min assist the longer we sat EOB.  Postural control: Left lateral lean Standing balance support: Bilateral upper extremity supported Standing balance-Leahy Scale: Poor Standing balance comment: min assist in static standing progressing to mod assist for dyanmic transfer. Needs support of RW and therapist.                             Cognition Arousal/Alertness: Lethargic Behavior During Therapy: Flat affect Overall Cognitive Status: History of cognitive impairments - at baseline                                 General Comments: Pt tells me his name, answers some questions, but not with great accuracy, more difficulty today following commands and initiating movement due to increased lethargy.  Turned lights on and opened blinds to help with delerium.              Pertinent Vitals/Pain Pain Assessment: Faces Faces  Pain Scale: No hurt       Prior Function            PT Goals (current goals can now be found in the care plan section) Acute Rehab PT Goals Patient Stated Goal: none stated Progress towards PT goals: Progressing toward goals    Frequency    Min 3X/week      PT Plan Current plan remains appropriate       AM-PAC PT "6 Clicks" Mobility   Outcome Measure  Help needed turning from your back to your side while in a flat bed without using bedrails?: A Lot Help needed moving from lying on your back to sitting on the side of a flat bed without using bedrails?: A  Lot Help needed moving to and from a bed to a chair (including a wheelchair)?: A Lot Help needed standing up from a chair using your arms (e.g., wheelchair or bedside chair)?: A Lot Help needed to walk in hospital room?: A Lot Help needed climbing 3-5 steps with a railing? : Total 6 Click Score: 11    End of Session   Activity Tolerance: Patient limited by fatigue;Patient limited by lethargy Patient left: in chair;with call bell/phone within reach;with chair alarm set Nurse Communication: Mobility status PT Visit Diagnosis: Unsteadiness on feet (R26.81);Other abnormalities of gait and mobility (R26.89);Muscle weakness (generalized) (M62.81)     Time: 7628-3151 PT Time Calculation (min) (ACUTE ONLY): 24 min  Charges:  $Therapeutic Activity: 23-37 mins                    Corinna Capra, PT, DPT  Acute Rehabilitation [phone] pager #[phone] office  @ [facility]: [phone]   04/12/2019, 11:28 AM

## 2019-04-15 LAB — CULTURE, BLOOD (ROUTINE X 2)
Culture: NO GROWTH
Culture: NO GROWTH
Special Requests: ADEQUATE
Special Requests: ADEQUATE

## 2021-10-07 IMAGING — CR DG LUMBAR SPINE 2-3V
3 series · 3 of 3 positions shown · non-contrast
Comparison: CT abdomen and pelvis 12/20/2016

CLINICAL DATA: Low back pain.  History of recent falls.

EXAM:
LUMBAR SPINE - 2-3 VIEW

[t l-spine a.p.]
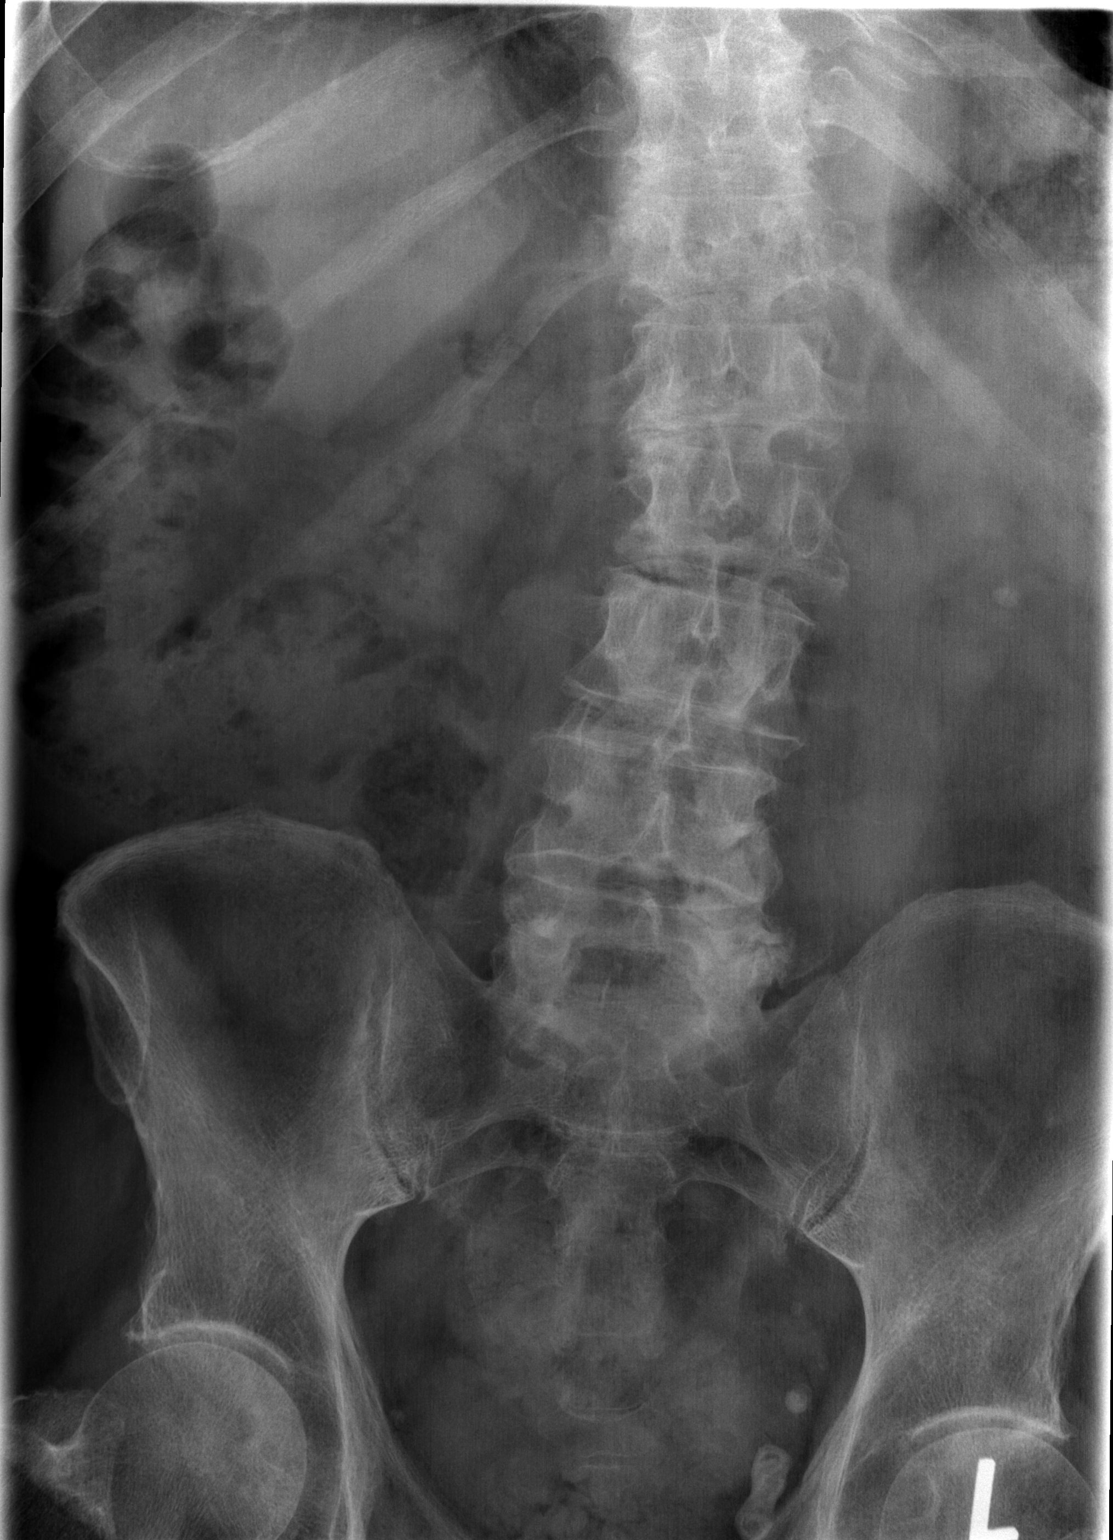

[t l-spine lat]
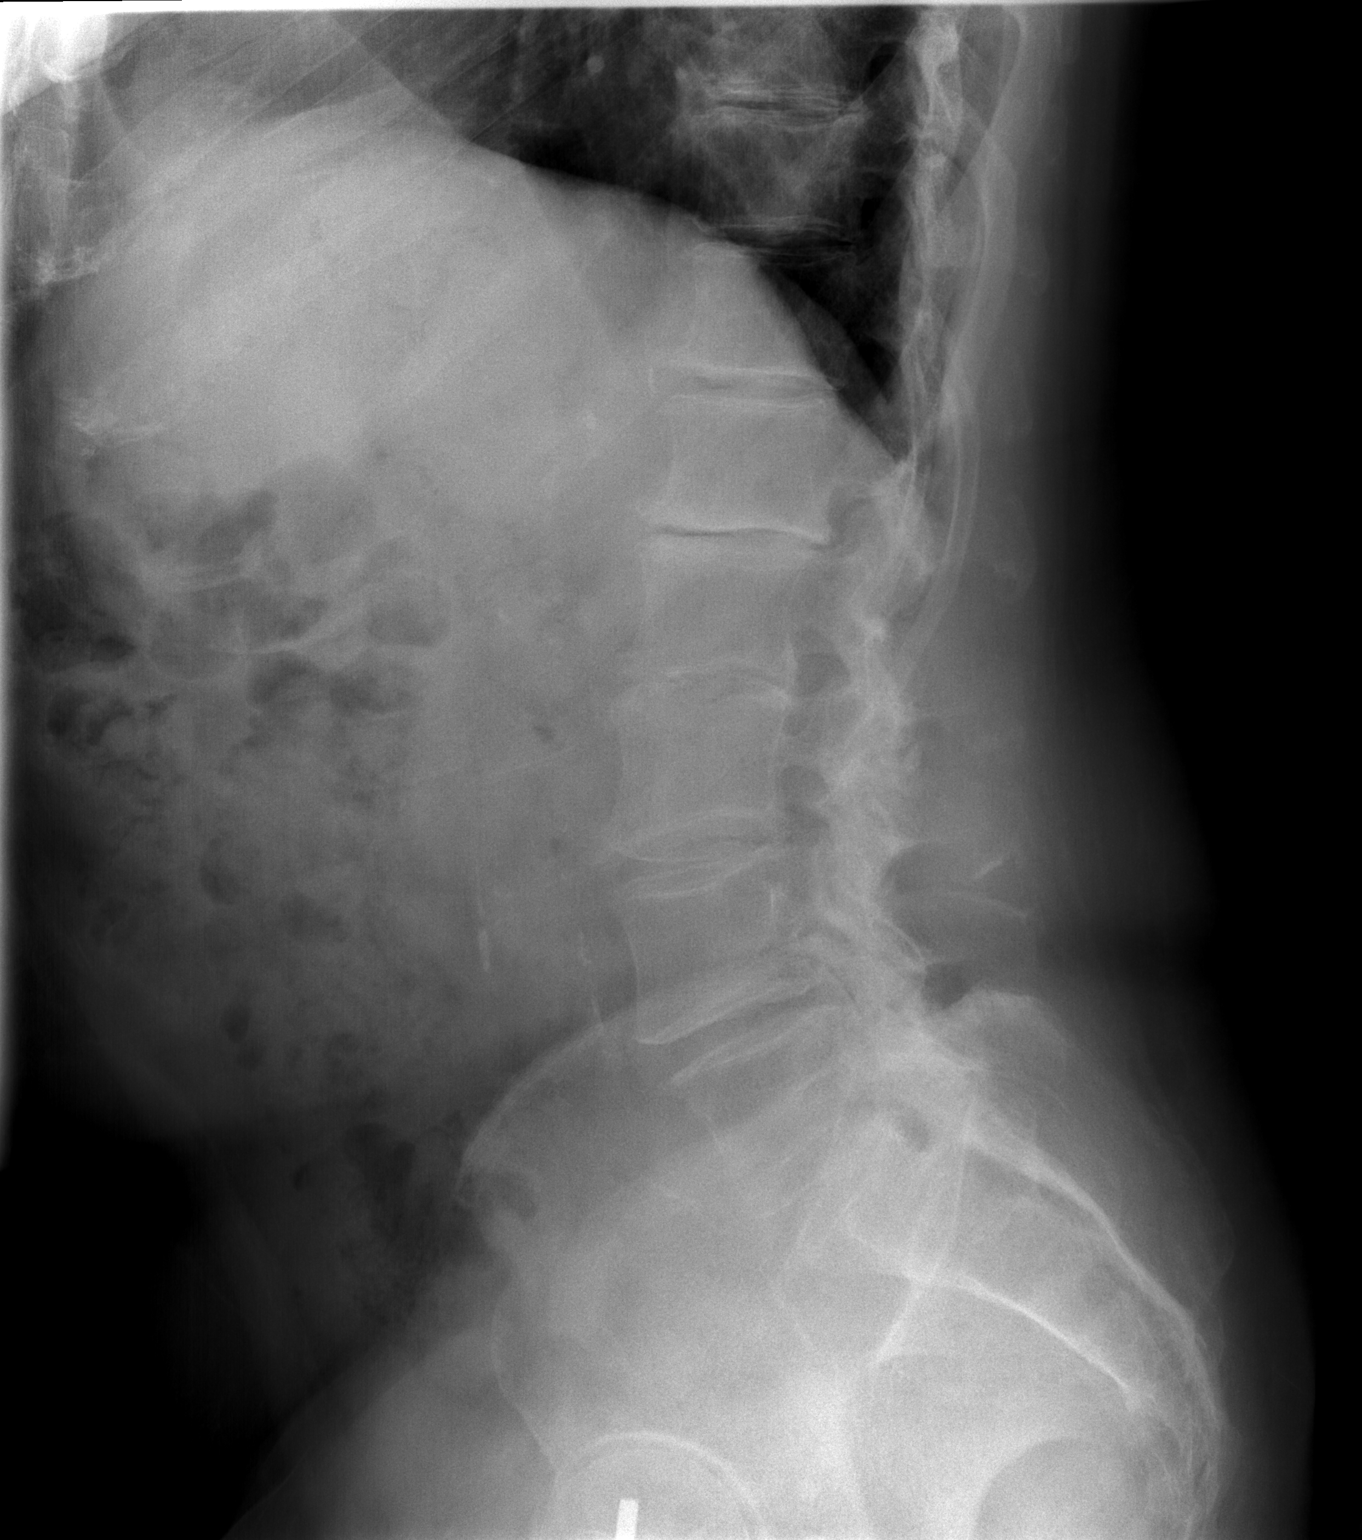

[t l-spine l5-s1 spot]
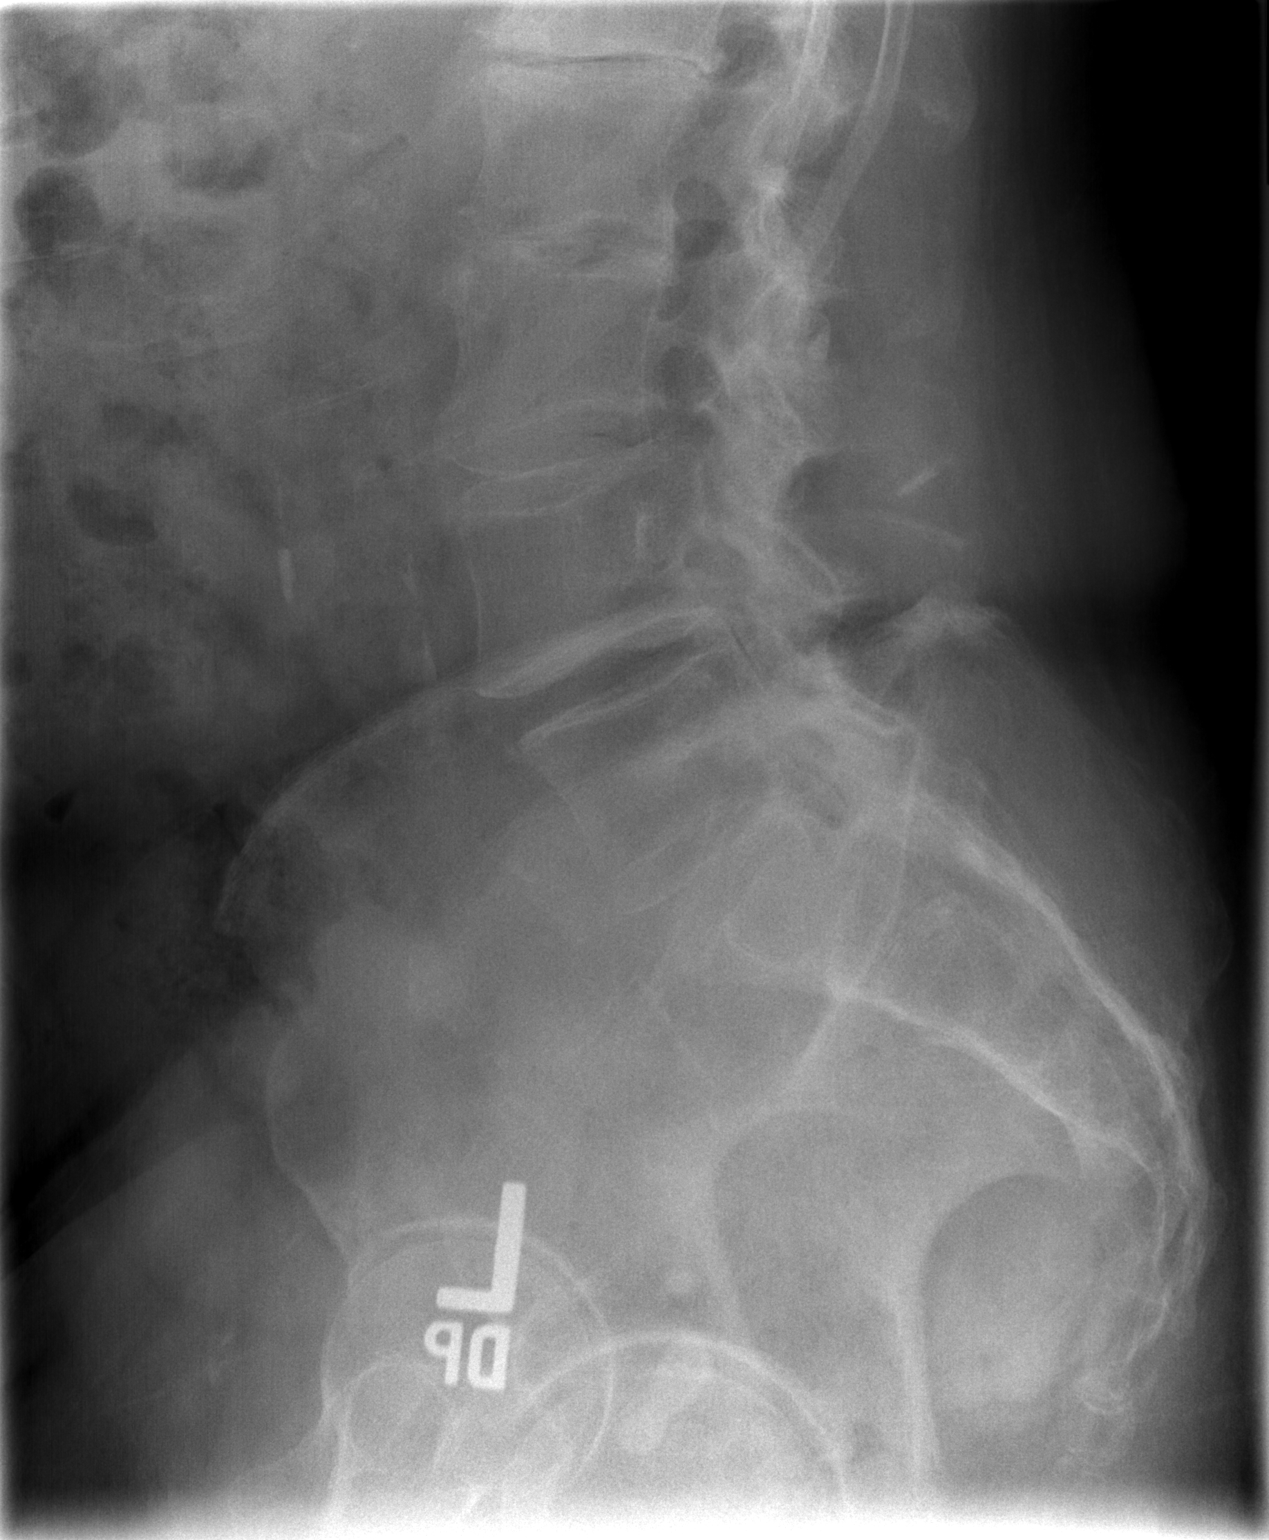

[3 of 3 positions shown; findings below may reference images not displayed]

FINDINGS: Levoscoliosis in the lumbar spine with the apex at L2. Again noted
is disc space narrowing and endplate disease at L1-L2. There is at
least mild disc space narrowing at L2-L3. The vertebral body heights
are maintained. Facet arthropathy in the lower lumbar spine
particularly along the left side of L5.
IMPRESSION: Multilevel degenerative changes in the lumbar spine with scoliosis.
Disc space narrowing is most prominent at L1-L2.

## 2022-02-18 IMAGING — DX DG CHEST 1V PORT
1 series · 1 of 1 positions shown · non-contrast
Comparison: Single-view of the chest 04/10/2019. PA and lateral
chest 04/30/2018.

CLINICAL DATA: Altered mental status.

EXAM:
PORTABLE CHEST 1 VIEW

[chest ap]
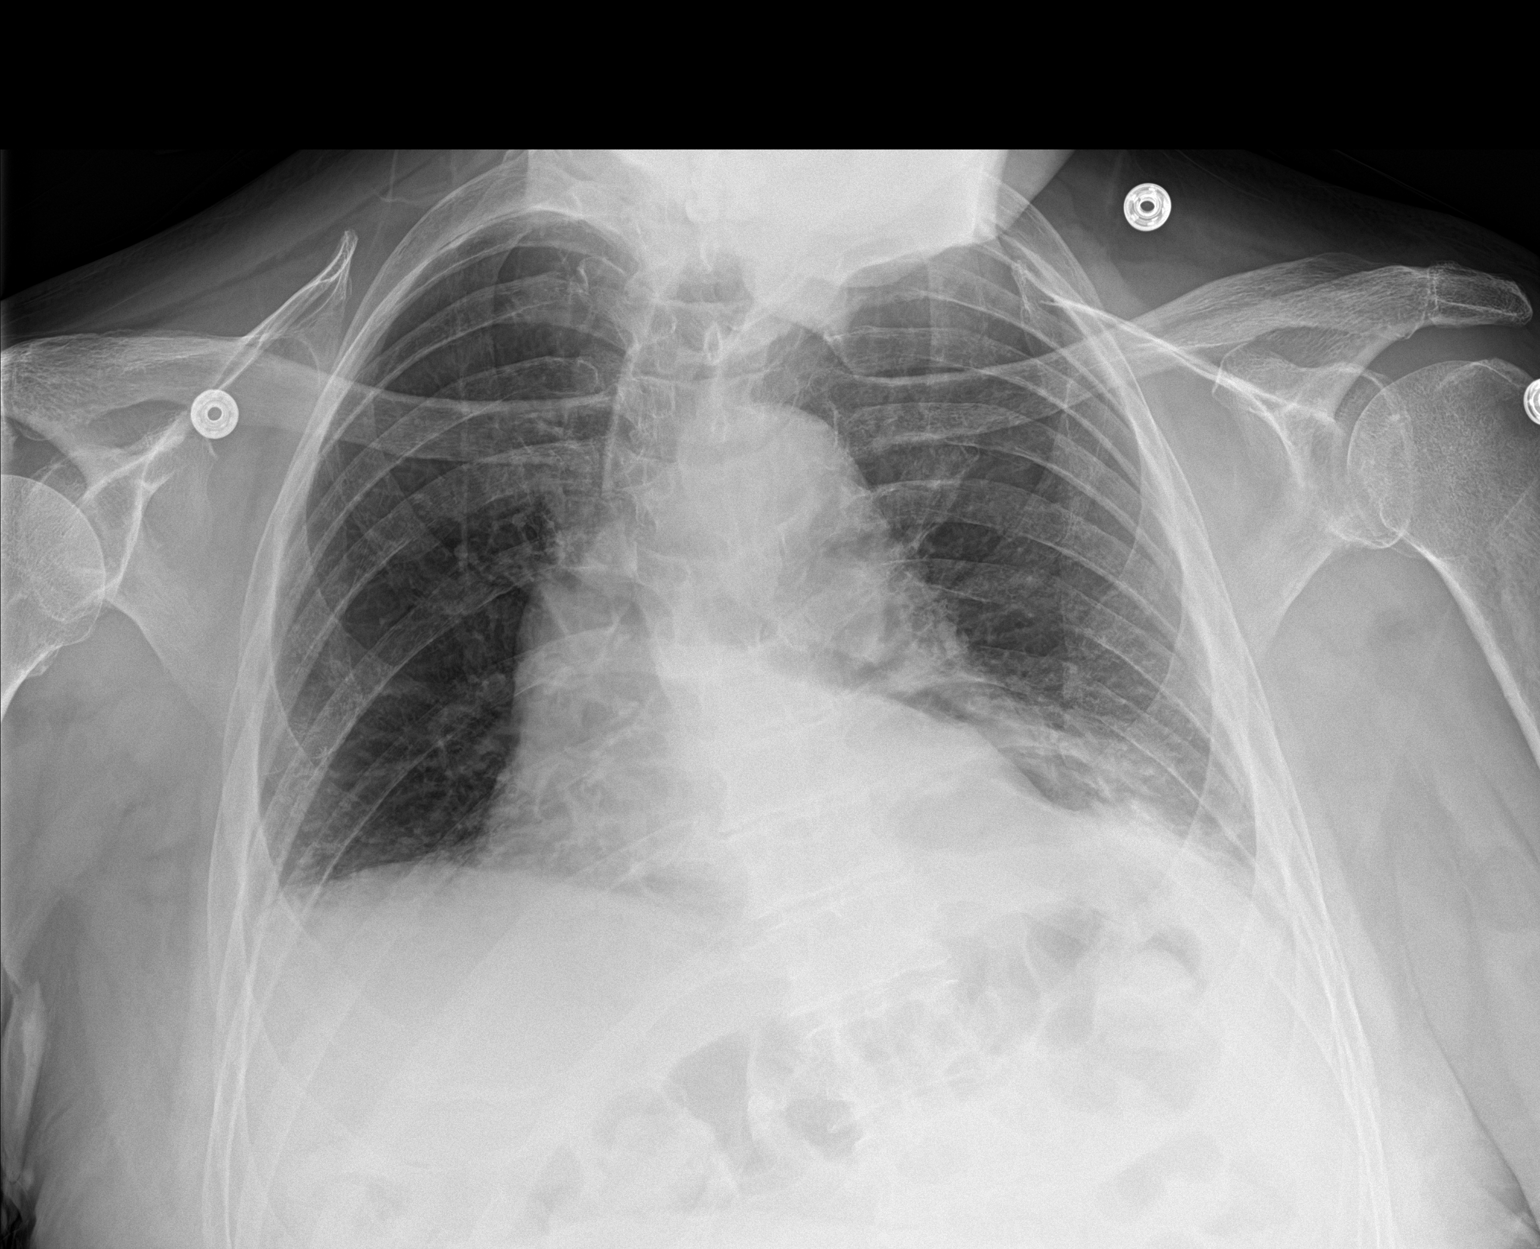

[1 of 1 positions shown; findings below may reference images not displayed]

FINDINGS: Mild bibasilar atelectasis low. Lung volumes no pneumothorax or
pleural effusion. Heart size is normal. Hiatal hernia noted.
IMPRESSION: No acute disease.

Hiatal hernia.
